# Patient Record
Sex: Female | Born: 1995 | Race: White | Hispanic: No | Marital: Single | Smoking: Never smoker
Health system: Southern US, Community
[De-identification: ages and names within clinical notes are randomized; demographics above are authoritative.]

## PROBLEM LIST (undated history)

## (undated) DIAGNOSIS — L940 Localized scleroderma [morphea]: Secondary | ICD-10-CM

## (undated) HISTORY — DX: Localized scleroderma (morphea): L94.0

---
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
## 2015-01-02 IMAGING — CR DG RIBS 2V*R*
2 series · 2 of 2 positions shown · non-contrast
Comparison: Chest x-ray same day.

CLINICAL DATA: Question focal lysis of right 11th rib

EXAM:
RIGHT RIBS - 2 VIEW

[rao (1 of 2)]
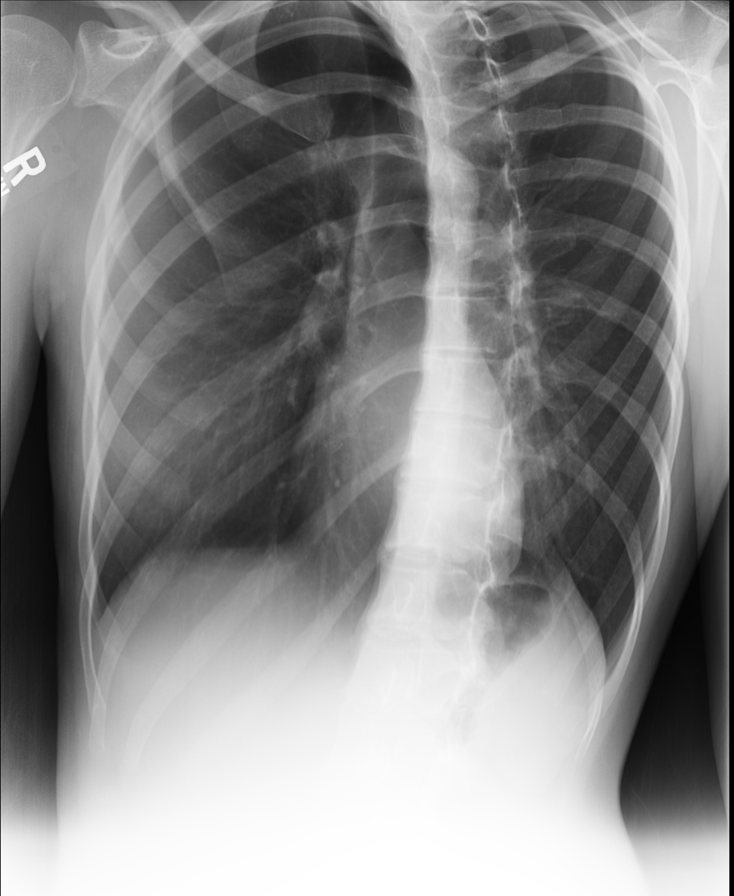

[rao (2 of 2)]
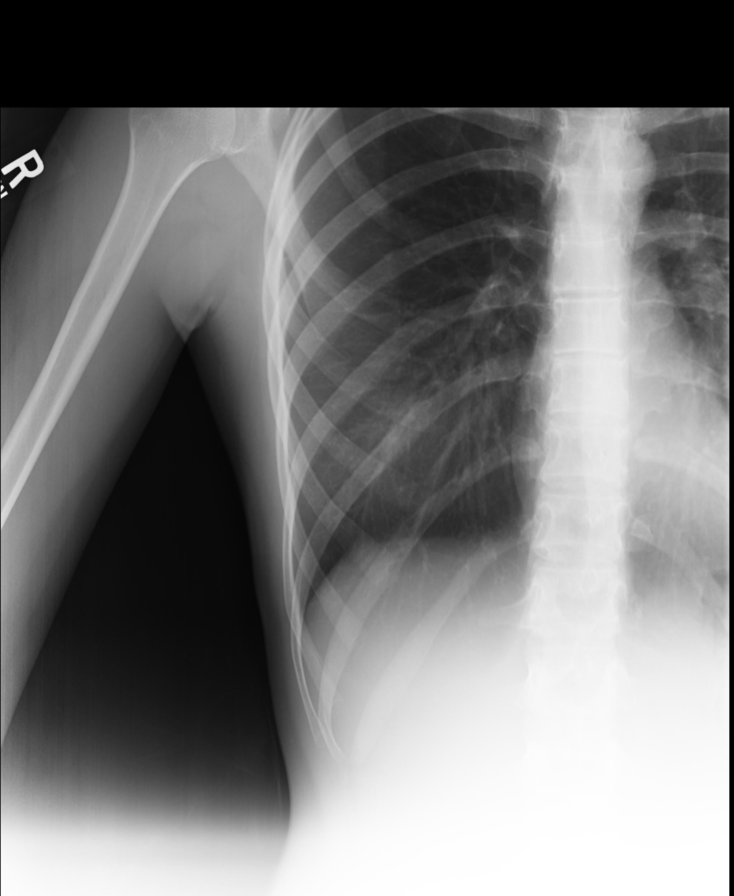

[2 of 2 positions shown; findings below may reference images not displayed]

FINDINGS: No fracture or other bone lesions are seen involving the ribs. No
diagnostic pneumothorax. Eleventh rib appears intact and appearance
on chest x-ray probable was artifactual.
IMPRESSION: Negative.

## 2015-10-04 ENCOUNTER — Ambulatory Visit (INDEPENDENT_AMBULATORY_CARE_PROVIDER_SITE_OTHER): Payer: BLUE CROSS/BLUE SHIELD | Admitting: Family Medicine

## 2015-10-04 VITALS — BP 102/64 | HR 85 | Temp 98.0°F | Resp 16 | Ht 66.0 in | Wt 106.0 lb

## 2015-10-04 DIAGNOSIS — Z131 Encounter for screening for diabetes mellitus: Secondary | ICD-10-CM

## 2015-10-04 DIAGNOSIS — Z1329 Encounter for screening for other suspected endocrine disorder: Secondary | ICD-10-CM

## 2015-10-04 DIAGNOSIS — L84 Corns and callosities: Secondary | ICD-10-CM

## 2015-10-04 DIAGNOSIS — Z0000 Encounter for general adult medical examination without abnormal findings: Secondary | ICD-10-CM | POA: Diagnosis not present

## 2015-10-04 DIAGNOSIS — Z114 Encounter for screening for human immunodeficiency virus [HIV]: Secondary | ICD-10-CM

## 2015-10-04 DIAGNOSIS — R002 Palpitations: Secondary | ICD-10-CM | POA: Diagnosis not present

## 2015-10-04 DIAGNOSIS — F411 Generalized anxiety disorder: Secondary | ICD-10-CM | POA: Diagnosis not present

## 2015-10-04 DIAGNOSIS — Z13220 Encounter for screening for lipoid disorders: Secondary | ICD-10-CM

## 2015-10-04 DIAGNOSIS — L219 Seborrheic dermatitis, unspecified: Secondary | ICD-10-CM

## 2015-10-04 DIAGNOSIS — R636 Underweight: Secondary | ICD-10-CM | POA: Diagnosis not present

## 2015-10-04 LAB — HEMOGLOBIN A1C
HEMOGLOBIN A1C: 5.3 % (ref <5.7)
MEAN PLASMA GLUCOSE: 105 mg/dL

## 2015-10-04 LAB — CBC WITH DIFFERENTIAL/PLATELET
BASOS ABS: 58 {cells}/uL (ref 0–200)
Basophils Relative: 1 %
Eosinophils Absolute: 116 cells/uL (ref 15–500)
Eosinophils Relative: 2 %
HEMATOCRIT: 42.3 % (ref 35.0–45.0)
HEMOGLOBIN: 14.2 g/dL (ref 11.7–15.5)
LYMPHS ABS: 1972 {cells}/uL (ref 850–3900)
Lymphocytes Relative: 34 %
MCH: 28.7 pg (ref 27.0–33.0)
MCHC: 33.6 g/dL (ref 32.0–36.0)
MCV: 85.6 fL (ref 80.0–100.0)
MPV: 9.8 fL (ref 7.5–12.5)
Monocytes Absolute: 290 cells/uL (ref 200–950)
Monocytes Relative: 5 %
NEUTROS ABS: 3364 {cells}/uL (ref 1500–7800)
NEUTROS PCT: 58 %
Platelets: 334 10*3/uL (ref 140–400)
RBC: 4.94 MIL/uL (ref 3.80–5.10)
RDW: 13.2 % (ref 11.0–15.0)
WBC: 5.8 10*3/uL (ref 3.8–10.8)

## 2015-10-04 LAB — LIPID PANEL
CHOL/HDL RATIO: 3 ratio (ref <=5.0)
CHOLESTEROL: 192 mg/dL — AB (ref 125–170)
HDL: 65 mg/dL (ref >=46)
LDL Cholesterol: 102 mg/dL (ref <110)
Triglycerides: 123 mg/dL (ref <150)
VLDL: 25 mg/dL (ref <30)

## 2015-10-04 LAB — COMPREHENSIVE METABOLIC PANEL
ALBUMIN: 4.7 g/dL (ref 3.6–5.1)
ALK PHOS: 64 U/L (ref 33–115)
ALT: 8 U/L (ref 6–29)
AST: 21 U/L (ref 10–30)
BILIRUBIN TOTAL: 0.3 mg/dL (ref 0.2–1.2)
BUN: 11 mg/dL (ref 7–25)
CALCIUM: 9.6 mg/dL (ref 8.6–10.2)
CO2: 22 mmol/L (ref 20–31)
CREATININE: 0.62 mg/dL (ref 0.50–1.10)
Chloride: 102 mmol/L (ref 98–110)
Glucose, Bld: 92 mg/dL (ref 65–99)
Potassium: 4 mmol/L (ref 3.5–5.3)
SODIUM: 137 mmol/L (ref 135–146)
TOTAL PROTEIN: 7.8 g/dL (ref 6.1–8.1)

## 2015-10-04 LAB — POCT URINALYSIS DIP (MANUAL ENTRY)
Bilirubin, UA: NEGATIVE
Blood, UA: NEGATIVE
Glucose, UA: NEGATIVE
Ketones, POC UA: NEGATIVE
Leukocytes, UA: NEGATIVE
Nitrite, UA: NEGATIVE
Protein Ur, POC: NEGATIVE
SPEC GRAV UA: 1.02
Urobilinogen, UA: 0.2
pH, UA: 5

## 2015-10-04 LAB — TSH: TSH: 1.6 m[IU]/L

## 2015-10-04 MED ORDER — KETOCONAZOLE 2 % EX SHAM: 1.0000 "application " | MEDICATED_SHAMPOO | CUTANEOUS | Status: DC

## 2015-10-04 NOTE — Patient Instructions (Addendum)
   IF you received an x-ray today, you will receive an invoice from [facility]. Please contact [facility] at [phone] with questions or concerns regarding your invoice.   IF you received labwork today, you will receive an invoice from [facility]. Please contact [facility] at [phone] with questions or concerns regarding your invoice.   Our billing staff will not be able to assist you with questions regarding bills from these companies.  You will be contacted with the lab results as soon as they are available. The fastest way to get your results is to activate your My Chart account. Instructions are located on the last page of this paperwork. If you have not heard from us regarding the results in 2 weeks, please contact this office.     Keeping You Healthy  Get These Tests 1. Blood Pressure- Have your blood pressure checked once a year by your health care provider.  Normal blood pressure is 120/80. 2. Weight- Have your body mass index (BMI) calculated to screen for obesity.  BMI is measure of body fat based on height and weight.  You can also calculate your own BMI at www.nhlbisupport.com/bmi/. 3. Cholesterol- Have your cholesterol checked every 5 years starting at age 20 then yearly starting at age 45. 4. Chlamydia, HIV, and other sexually transmitted diseases- Get screened every year until age 25, then within three months of each new sexual provider. 5. Pap Test - Every 1-5 years; discuss with your health care provider. 6. Mammogram- Every 1-2 years starting at age 40--50  Take these medicines  Calcium with Vitamin D-Your body needs 1200 mg of Calcium each day and 800-1000 IU of Vitamin D daily.  Your body can only absorb 500 mg of Calcium at a time so Calcium must be taken in 2 or 3 divided doses throughout the day.  Multivitamin with folic acid- Once daily if it is possible for you to become pregnant.  Get these  Immunizations  Gardasil-Series of three doses; prevents HPV related illness such as genital warts and cervical cancer.  Menactra-Single dose; prevents meningitis.  Tetanus shot- Every 10 years.  Flu shot-Every year.  Take these steps 1. Do not smoke-Your healthcare provider can help you quit.  For tips on how to quit go to www.smokefree.gov or call 1-800 QUITNOW. 2. Be physically active- Exercise 5 days a week for at least 30 minutes.  If you are not already physically active, start slow and gradually work up to 30 minutes of moderate physical activity.  Examples of moderate activity include walking briskly, dancing, swimming, bicycling, etc. 3. Breast Cancer- A self breast exam every month is important for early detection of breast cancer.  For more information and instruction on self breast exams, ask your healthcare provider or www.womenshealth.gov/faq/breast-self-exam.cfm. 4. Eat a healthy diet- Eat a variety of healthy foods such as fruits, vegetables, whole grains, low fat milk, low fat cheeses, yogurt, lean meats, poultry and fish, beans, nuts, tofu, etc.  For more information go to www. Thenutritionsource.org 5. Drink alcohol in moderation- Limit alcohol intake to one drink or less per day. Never drink and drive. 6. Depression- Your emotional health is as important as your physical health.  If you're feeling down or losing interest in things you normally enjoy please talk to your healthcare provider about being screened for depression. 7. Dental visit- Brush and floss your teeth twice daily; visit your dentist twice a year. 8. Eye doctor- Get an eye exam at least every   2 years. 9. Helmet use- Always wear a helmet when riding a bicycle, motorcycle, rollerblading or skateboarding. 10. Safe sex- If you may be exposed to sexually transmitted infections, use a condom. 11. Seat belts- Seat belts can save your live; always wear one. 12. Smoke/Carbon Monoxide detectors- These detectors need to  be installed on the appropriate level of your home. Replace batteries at least once a year. 13. Skin cancer- When out in the sun please cover up and use sunscreen 15 SPF or higher. 14. Violence- If anyone is threatening or hurting you, please tell your healthcare provider.       HPV Vaccine Gardasil (Human Papillomavirus): What You Need to Know 1. What is HPV? Genital human papillomavirus (HPV) is the most common sexually transmitted virus in the [place]. More than half of sexually active men and women are infected with HPV at some time in their lives. About 20 million Americans are currently infected, and about 6 million more get infected each year. HPV is usually spread through sexual contact. Most HPV infections don't cause any symptoms, and go away on their own. But HPV can cause cervical cancer in women. Cervical cancer is the 2nd leading cause of cancer deaths among women around the world. In the [place], about 12,000 women get cervical cancer every year and about 4,000 are expected to die from it. HPV is also associated with several less common cancers, such as vaginal and vulvar cancers in women, and anal and oropharyngeal (back of the throat, including base of tongue and tonsils) cancers in both men and women. HPV can also cause genital warts and warts in the throat. There is no cure for HPV infection, but some of the problems it causes can be treated. 2. HPV vaccine: Why get vaccinated? The HPV vaccine you are getting is one of two vaccines that can be given to prevent HPV. It may be given to both males and females.  This vaccine can prevent most cases of cervical cancer in females, if it is given before exposure to the virus. In addition, it can prevent vaginal and vulvar cancer in females, and genital warts and anal cancer in both males and females. Protection from HPV vaccine is expected to be long-lasting. But vaccination is not a substitute for cervical cancer  screening. Women should still get regular Pap tests. 3. Who should get this HPV vaccine and when? HPV vaccine is given as a 3-dose series  1st Dose: Now  2nd Dose: 1 to 2 months after Dose 1  3rd Dose: 6 months after Dose 1 Additional (booster) doses are not recommended. Routine vaccination 7. This HPV vaccine is recommended for girls and boys 33 or 20 years of age. It may be given starting at age 12. Why is HPV vaccine recommended at 47 or 20 years of age?  HPV infection is easily acquired, even with only one sex partner. That is why it is important to get HPV vaccine before any sexual contact takes place. Also, response to the vaccine is better at this age than at older ages. Catch-up vaccination This vaccine is recommended for the following people who have not completed the 3-dose series:  3. Females 13 through 21 years of age. 4. Males 13 through 20 years of age. This vaccine may be given to men 22 through 20 years of age who have not completed the 3-dose series. It is recommended for men through age 43 who have sex with men or whose immune system is  weakened because of HIV infection, other illness, or medications.  HPV vaccine may be given at the same time as other vaccines. 4. Some people should not get HPV vaccine or should wait. 5. Anyone who has ever had a life-threatening allergic reaction to any component of HPV vaccine, or to a previous dose of HPV vaccine, should not get the vaccine. Tell your doctor if the person getting vaccinated has any severe allergies, including an allergy to yeast. 6. HPV vaccine is not recommended for pregnant women. However, receiving HPV vaccine when pregnant is not a reason to consider terminating the pregnancy. Women who are breast feeding may get the vaccine. 7. People who are mildly ill when a dose of HPV is planned can still be vaccinated. People with a moderate or severe illness should wait until they are better. 5. What are the risks from this  vaccine? This HPV vaccine has been used in the [place] and around the world for about six years and has been very safe. However, any medicine could possibly cause a serious problem, such as a severe allergic reaction. The risk of any vaccine causing a serious injury, or death, is extremely small. Life-threatening allergic reactions from vaccines are very rare. If they do occur, it would be within a few minutes to a few hours after the vaccination. Several mild to moderate problems are known to occur with this HPV vaccine. These do not last long and go away on their own.  Reactions in the arm where the shot was given:  Pain (about 8 people in 10)  Redness or swelling (about 1 person in 4)  Fever:  Mild (100 F) (about 1 person in 10)  Moderate (102 F) (about 1 person in 76)  Other problems:  Headache (about 1 person in 3)  Fainting: Brief fainting spells and related symptoms (such as jerking movements) can happen after any medical procedure, including vaccination. Sitting or lying down for about 15 minutes after a vaccination can help prevent fainting and injuries caused by falls. Tell your doctor if the patient feels dizzy or light-headed, or has vision changes or ringing in the ears.  Like all vaccines, HPV vaccines will continue to be monitored for unusual or severe problems. 6. What if there is a serious reaction? What should I look for? 15. Look for anything that concerns you, such as signs of a severe allergic reaction, very high fever, or behavior changes. Signs of a severe allergic reaction can include hives, swelling of the face and throat, difficulty breathing, a fast heartbeat, dizziness, and weakness. These would start a few minutes to a few hours after the vaccination.  What should I do?  If you think it is a severe allergic reaction or other emergency that can't wait, call 9-1-1 or get the person to the nearest hospital. Otherwise, call your doctor.  Afterward, the  reaction should be reported to the Vaccine Adverse Event Reporting System (VAERS). Your doctor might file this report, or you can do it yourself through the VAERS web site at www.vaers.LAgents.no, or by calling 1-(810)865-8093. VAERS is only for reporting reactions. They do not give medical advice. 7. The National Vaccine Injury Compensation Program  The [employer] Vaccine Injury Compensation Program (VICP) is a federal program that was created to compensate people who may have been injured by certain vaccines.  Persons who believe they may have been injured by a vaccine can learn about the program and about filing a claim by calling 1-920-654-4895 or visiting the  VICP website at SpiritualWord.atwww.hrsa.gov/vaccinecompensation. 8. How can I learn more?  Ask your doctor.  Call your local or state health department.  Contact the Centers for Disease Control and Prevention (CDC):  Call (949)024-14951-435-767-7125 (1-800-CDC-INFO)  or  Visit CDC's website at PicCapture.uywww.cdc.gov/vaccines CDC Human Papillomavirus (HPV) Gardasil (Interim) 09/26/11   This information is not intended to replace advice given to you by your health care provider. Make sure you discuss any questions you have with your health care provider.   Document Released: 02/23/2006 Document Revised: 05/19/2014 Document Reviewed: 06/09/2013 Elsevier Interactive Patient Education [employer]2016 Elsevier Inc.

## 2015-10-04 NOTE — Progress Notes (Signed)
Subjective:    Patient ID: Jenny Wells, female    DOB: Mar 25, 1996, 20 y.o.   MRN: [id number]  10/04/2015  Annual Exam   HPI This 20 y.o. female presents for Complete Physical Examination.  Last physical:  09-09-2014 Pap smear: n/a; menses regular; bleeding 5 days; some cramping; sometimes  Heavy.   TDAP:  Last year at [facility]; 2016. Gardisil:  None; heard about it but not interested.  Influenza: none Eye exam:  +glasses; yearly. Dental exam:  Every six months;   Growth on toe:  R third toe; no pain.    Scalp: seborrhea dermatitis? Research?  Waxing build up and itchy.  Top of scalp.  Nizoral and dandruff shampoos with mild relief.  Mild improvement; stable and persistent.   B: oatmeal, water Snack: banana, water Lunch: chicken pannini, water; cucumber Snack: chips Supper: burger, cucumbers, water. Snack: none  Sweet tea.  Palpitations chronic; a few seconds; occurs each day.  S/p two EKGs in the past that are normal. Worried it is due to underlying anxiety; only suffers with health anxiety; not anxious about other things.   Trying to gain weight.  Started exercising yesterday to gain muscle mass.   Review of Systems  Constitutional: Negative for fever, chills, diaphoresis, activity change, appetite change, fatigue and unexpected weight change.  HENT: Negative for congestion, dental problem, drooling, ear discharge, ear pain, facial swelling, hearing loss, mouth sores, nosebleeds, postnasal drip, rhinorrhea, sinus pressure, sneezing, sore throat, tinnitus, trouble swallowing and voice change.   Eyes: Negative for photophobia, pain, discharge, redness, itching and visual disturbance.  Respiratory: Negative for apnea, cough, choking, chest tightness, shortness of breath, wheezing and stridor.   Cardiovascular: Positive for palpitations. Negative for chest pain and leg swelling.  Gastrointestinal: Negative for nausea, vomiting, abdominal pain, diarrhea, constipation, blood in  stool, abdominal distention, anal bleeding and rectal pain.  Endocrine: Negative for cold intolerance, heat intolerance, polydipsia, polyphagia and polyuria.  Genitourinary: Negative for dysuria, urgency, frequency, hematuria, flank pain, decreased urine volume, vaginal bleeding, vaginal discharge, enuresis, difficulty urinating, genital sores, vaginal pain, menstrual problem, pelvic pain and dyspareunia.  Musculoskeletal: Negative for myalgias, back pain, joint swelling, arthralgias, gait problem, neck pain and neck stiffness.  Skin: Positive for color change and rash. Negative for pallor and wound.  Allergic/Immunologic: Negative for environmental allergies, food allergies and immunocompromised state.  Neurological: Negative for dizziness, tremors, seizures, syncope, facial asymmetry, speech difficulty, weakness, light-headedness, numbness and headaches.  Hematological: Negative for adenopathy. Does not bruise/bleed easily.  Psychiatric/Behavioral: Negative for suicidal ideas, hallucinations, behavioral problems, confusion, sleep disturbance, self-injury, dysphoric mood, decreased concentration and agitation. The patient is nervous/anxious. The patient is not hyperactive.     History reviewed. No pertinent past medical history. History reviewed. No pertinent past surgical history. No Known Allergies Current Outpatient Prescriptions  Medication Sig Dispense Refill  . ketoconazole (NIZORAL) 2 % shampoo Apply 1 application topically 2 (two) times a week. 120 mL 11   No current facility-administered medications for this visit.   Social History   Social History  . Marital Status: Single    Spouse Name: N/A  . Number of Children: N/A  . Years of Education: N/A   Occupational History  . Not on file.   Social History Main Topics  . Smoking status: Never Smoker   . Smokeless tobacco: Never Used  . Alcohol Use: No  . Drug Use: No  . Sexual Activity: No   Other Topics Concern  . Not on  file  Social History Narrative   Marital status: single; not dating      Children: none      Lives: with parents, brother      Employment:  Consulting civil engineertudent      Education: [employer]; grades good; majoring in business; not sure of career plans.      Tobacco: none       Alcohol: none      Drugs: none      Seatbelt: 100%; no texting while driving      Sexual activity: none; males only.      Exercise: treadmill started yesterday.     Family History  Problem Relation Age of Onset  . Asthma Father        Objective:    BP 102/64 mmHg  Pulse 85  Temp(Src) 98 F (36.7 C)  Resp 16  Ht 5\' 6"  (1.676 m)  Wt 106 lb (48.081 kg)  BMI 17.12 kg/m2  SpO2 98%  LMP 09/15/2015 (Approximate) Physical Exam  Constitutional: She is oriented to person, place, and time. She appears well-developed and well-nourished. No distress.  HENT:  Head: Normocephalic and atraumatic.  Right Ear: External ear normal.  Left Ear: External ear normal.  Nose: Nose normal.  Mouth/Throat: Oropharynx is clear and moist.  Eyes: Conjunctivae and EOM are normal. Pupils are equal, round, and reactive to light.  Neck: Normal range of motion and full passive range of motion without pain. Neck supple. No JVD present. Carotid bruit is not present. No thyromegaly present.  Cardiovascular: Normal rate, regular rhythm and normal heart sounds.  Exam reveals no gallop and no friction rub.   No murmur heard. Pulmonary/Chest: Effort normal and breath sounds normal. She has no wheezes. She has no rales.  Abdominal: Soft. Bowel sounds are normal. She exhibits no distension and no mass. There is no tenderness. There is no rebound and no guarding.  Genitourinary:  Tanner IV.  Normal breast development  Musculoskeletal:       Right shoulder: Normal.       Left shoulder: Normal.       Cervical back: Normal.  Lymphadenopathy:    She has no cervical adenopathy.  Neurological: She is alert and oriented to person, place, and time. She  has normal reflexes. No cranial nerve deficit. She exhibits normal muscle tone. Coordination normal.  Skin: Skin is warm and dry. No rash noted. She is not diaphoretic. No erythema. No pallor.  R third toe with corn formation.  Scattered skin discoloration with thickening along back, torso.    Psychiatric: She has a normal mood and affect. Her behavior is normal. Judgment and thought content normal.  Nursing note and vitals reviewed.  Results for orders placed or performed in visit on 09/09/14  Comprehensive metabolic panel  Result Value Ref Range   Sodium 138 135 - 145 mEq/L   Potassium 4.0 3.5 - 5.3 mEq/L   Chloride 105 96 - 112 mEq/L   CO2 22 19 - 32 mEq/L   Glucose, Bld 92 70 - 99 mg/dL   BUN 11 6 - 23 mg/dL   Creat 1.610.59 0.960.50 - 0.451.10 mg/dL   Total Bilirubin 0.3 0.2 - 1.1 mg/dL   Alkaline Phosphatase 59 39 - 117 U/L   AST 21 0 - 37 U/L   ALT 13 0 - 35 U/L   Total Protein 7.4 6.0 - 8.3 g/dL   Albumin 4.5 3.5 - 5.2 g/dL   Calcium 9.3 8.4 - 40.910.5 mg/dL  CBC  Result  Value Ref Range   WBC 5.4 4.0 - 10.5 K/uL   RBC 4.62 3.87 - 5.11 MIL/uL   Hemoglobin 13.5 12.0 - 15.0 g/dL   HCT 16.1 09.6 - 04.5 %   MCV 85.7 78.0 - 100.0 fL   MCH 29.2 26.0 - 34.0 pg   MCHC 34.1 30.0 - 36.0 g/dL   RDW 40.9 81.1 - 91.4 %   Platelets 317 150 - 400 K/uL   MPV 9.4 8.6 - 12.4 fL  TSH  Result Value Ref Range   TSH 3.553 0.350 - 4.500 uIU/mL  Lipid panel  Result Value Ref Range   Cholesterol 168 0 - 200 mg/dL   Triglycerides 782 <956 mg/dL   HDL 47 36 - 76 mg/dL   Total CHOL/HDL Ratio 3.6 Ratio   VLDL 26 0 - 40 mg/dL   LDL Cholesterol 95 0 - 99 mg/dL       Assessment & Plan:   1. Routine physical examination   2. Screening for diabetes mellitus   3. Screening, lipid   4. Screening for thyroid disorder   5. Screening for HIV (human immunodeficiency virus)   6. Underweight   7. Corn of foot   8. Palpitations   9. Anxiety state   10. Seborrhea    -anticipatory guidance --- exercise,  weight gain; incorporate nuts, peanut butter, ice cream or whole milk into diet. -provided information on HPV series; also discussed at length during visit.  Pt to consider. -obtain screening labs per pt request. -normal menses; never sexually active. -corn on R foot asymptomatic; no intervention at this time. -suffering with waxy scalp yet benign exam; pt desires trial of Ketoconazole shampoo twice weekly; recommend dermatology evaluation if no improvement. -mild anxiety present --- advised regular exercise for anxiety management, 7-9 hours of sleep per night, avoidance of caffeine.  RTC if worsens. -mild palpitations chronic; s/p work up in the past; does not desire further work up at this time.   Orders Placed This Encounter  Procedures  . CBC with Differential/Platelet  . Comprehensive metabolic panel    Order Specific Question:  Has the patient fasted?    Answer:  Yes  . Hemoglobin A1c  . Lipid panel    Order Specific Question:  Has the patient fasted?    Answer:  Yes  . TSH  . HIV antibody  . Care order/instruction    AVS printed - let patient go!  Marland Kitchen POCT urinalysis dipstick   Meds ordered this encounter  Medications  . ketoconazole (NIZORAL) 2 % shampoo    Sig: Apply 1 application topically 2 (two) times a week.    Dispense:  120 mL    Refill:  11    No Follow-up on file.    Stavros Cail Paulita Fujita, M.D. [facility] [address] [place], [place]  [address] [phone] phone [phone] fax

## 2015-10-05 LAB — HIV ANTIBODY (ROUTINE TESTING W REFLEX): HIV 1&2 Ab, 4th Generation: NONREACTIVE

## 2015-10-17 ENCOUNTER — Ambulatory Visit (INDEPENDENT_AMBULATORY_CARE_PROVIDER_SITE_OTHER): Payer: BLUE CROSS/BLUE SHIELD | Admitting: Physician Assistant

## 2015-10-17 VITALS — BP 114/76 | HR 78 | Temp 97.9°F | Resp 16 | Ht 66.0 in | Wt 105.8 lb

## 2015-10-17 DIAGNOSIS — M25562 Pain in left knee: Secondary | ICD-10-CM | POA: Diagnosis not present

## 2015-10-17 NOTE — Patient Instructions (Signed)
     IF you received an x-ray today, you will receive an invoice from [facility]. Please contact [facility] at [phone] with questions or concerns regarding your invoice.   IF you received labwork today, you will receive an invoice from [facility]. Please contact [facility] at [phone] with questions or concerns regarding your invoice.   Our billing staff will not be able to assist you with questions regarding bills from these companies.  You will be contacted with the lab results as soon as they are available. The fastest way to get your results is to activate your My Chart account. Instructions are located on the last page of this paperwork. If you have not heard from us regarding the results in 2 weeks, please contact this office.      

## 2015-10-17 NOTE — Progress Notes (Signed)
   10/17/2015 2:52 PM   DOB: May 10, 1996 / MRN: [id number]  SUBJECTIVE:  Jenny Wells is a 20 y.o. female presenting for left anterior leg pain that is worse with plantar flexion.  State that she was walking on the treadmill bare footed roughly a week ago and the pain started after that. She is worried that it is a blood clot.  She denies leg swelling, does not take birth control, is a never smoke and has never had a blood clot.  Denies bony trauma.    She has No Known Allergies.   She  has no past medical history on file.    She  reports that she has never smoked. She has never used smokeless tobacco. She reports that she does not drink alcohol or use illicit drugs. She  reports that she does not engage in sexual activity. The patient  has no past surgical history on file.  Her family history includes Asthma in her father.  Review of Systems  Constitutional: Negative for fever and chills.  Respiratory: Negative for cough.   Cardiovascular: Negative for chest pain.  Gastrointestinal: Negative for nausea and vomiting.  Skin: Negative for rash.  Neurological: Negative for dizziness and headaches.    Problem list and medications reviewed and updated by myself where necessary, and exist elsewhere in the encounter.   OBJECTIVE:  BP 114/76 mmHg  Pulse 78  Temp(Src) 97.9 F (36.6 C) (Oral)  Resp 16  Ht 5\' 6"  (1.676 m)  Wt 105 lb 12.8 oz (47.991 kg)  BMI 17.08 kg/m2  SpO2 98%  LMP 09/15/2015 (Approximate)  Physical Exam  Constitutional: She is oriented to person, place, and time. She appears well-developed.  Eyes: EOM are normal. Pupils are equal, round, and reactive to light.  Cardiovascular: Normal rate and regular rhythm.   Pulmonary/Chest: Effort normal and breath sounds normal.  Abdominal: She exhibits no distension.  Musculoskeletal: Normal range of motion.       Legs: Neurological: She is alert and oriented to person, place, and time. No cranial nerve deficit.  Skin: Skin is  warm and dry. She is not diaphoretic.  Psychiatric: She has a normal mood and affect.  Vitals reviewed.   No results found for this or any previous visit (from the past 72 hour(s)).  No results found.  ASSESSMENT AND PLAN  Jenny Wells was seen today for leg pain.  Diagnoses and all orders for this visit:  Arthralgia of left lower leg: Most likely MSK pain from a originating from the peroneal group or the tibialis anterior.  She has no signs, symptoms, or history that would lend to the dx of a blood clot.  I offered to run a d-dimer to ease her mind however she then became concerned about the cost and deciding against this.     The patient was advised to call or return to clinic if she does not see an improvement in symptoms or to seek the care of the closest emergency department if she worsens with the above plan.   Deliah BostonMichael Clark, MHS, PA-C [facility] 10/17/2015 2:52 PM

## 2015-11-06 ENCOUNTER — Ambulatory Visit (INDEPENDENT_AMBULATORY_CARE_PROVIDER_SITE_OTHER): Payer: BLUE CROSS/BLUE SHIELD | Admitting: Family Medicine

## 2015-11-06 VITALS — BP 118/78 | HR 85 | Temp 97.8°F | Resp 18 | Ht 66.0 in | Wt 106.0 lb

## 2015-11-06 DIAGNOSIS — M79662 Pain in left lower leg: Secondary | ICD-10-CM

## 2015-11-06 DIAGNOSIS — S8011XA Contusion of right lower leg, initial encounter: Secondary | ICD-10-CM | POA: Diagnosis not present

## 2015-11-06 DIAGNOSIS — F418 Other specified anxiety disorders: Secondary | ICD-10-CM

## 2015-11-06 LAB — POCT CBC
Granulocyte percent: 63.7 %G (ref 37–80)
HCT, POC: 39.9 % (ref 37.7–47.9)
Hemoglobin: 14.2 g/dL (ref 12.2–16.2)
LYMPH, POC: 2.1 (ref 0.6–3.4)
MCH, POC: 29.8 pg (ref 27–31.2)
MCHC: 35.6 g/dL — AB (ref 31.8–35.4)
MCV: 83.5 fL (ref 80–97)
MID (CBC): 0.4 (ref 0–0.9)
MPV: 7.3 fL (ref 0–99.8)
PLATELET COUNT, POC: 307 10*3/uL (ref 142–424)
POC Granulocyte: 4.3 (ref 2–6.9)
POC LYMPH %: 30.7 % (ref 10–50)
POC MID %: 5.6 %M (ref 0–12)
RBC: 4.78 M/uL (ref 4.04–5.48)
RDW, POC: 12.8 %
WBC: 6.7 10*3/uL (ref 4.6–10.2)

## 2015-11-06 LAB — D-DIMER, QUANTITATIVE (NOT AT ARMC)

## 2015-11-06 NOTE — Progress Notes (Addendum)
Subjective:  By signing my name below, I, Jenny Wells, attest that this documentation has been prepared under the direction and in the presence of Merri Ray, MD. Electronically Signed: Moises Wells, [place]. 11/06/2015 , 5:26 PM .  Patient was seen in Room 5 .   Patient ID: Jenny Wells, female    DOB: May 02, 1996, 20 y.o.   MRN: [id number] Chief Complaint  Patient presents with  . Leg Pain    Left leg pain   HPI Jenny Wells is a 20 y.o. female Here for left leg pain. She was seen 3 weeks ago for left anterior lower leg pain. She noted after walking on a treadmill bare-footed 1 week prior. No apparent risk factors for DVT and d-dimer was declined. She is more concerned after Googling about her symptoms.   Patient states the pain is still present and so she wants to have a d-dimer done. She notes the pain has radiated to the posterior of her left lower leg. She informs the pain happens multiple times a day at random times. She was given ace wrap during previous visit without relief. She's also tried elevating her leg without relief. She denies pain with walking. She denies change in pain since previous visit. She denies the pain waking her up at night. She denies taking OTC medications. She denies recent exercising. She denies family history for Wells clots or DVT's. She denies problems with her periods.   She also mentions bruising over her right lower leg. She notes also having a small bruise over her left leg but this has resolved.   She also notes having anxiety and causing her to have more shortness of breath recently. She believes her anxiety has worsened due to this left leg pain. She has anxiety during school but didn't think it was severe enough for treatment.   She attends [employer] for business. She usually drives to class.  She's brought in by her mother today.   There are no active problems to display for this patient.  No past medical history on file. No past surgical  history on file. No Known Allergies Prior to Admission medications   Not on File   Social History   Social History  . Marital Status: Single    Spouse Name: N/A  . Number of Children: N/A  . Years of Education: N/A   Occupational History  . Not on file.   Social History Main Topics  . Smoking status: Never Smoker   . Smokeless tobacco: Never Used  . Alcohol Use: No  . Drug Use: No  . Sexual Activity: No   Other Topics Concern  . Not on file   Social History Narrative   Marital status: single; not dating      Children: none      Lives: with parents, brother      Employment:  Ship broker      Education: [employer] sophomore; grades good; majoring in business; not sure of career plans.      Tobacco: none       Alcohol: none      Drugs: none      Seatbelt: 100%; no texting while driving      Sexual activity: none; males only.      Exercise: treadmill started yesterday.     Review of Systems  Constitutional: Negative for fever, chills and fatigue.  Cardiovascular: Negative for leg swelling.  Gastrointestinal: Negative for nausea, vomiting and diarrhea.  Genitourinary: Negative for menstrual problem.  Musculoskeletal:  Positive for myalgias. Negative for joint swelling, arthralgias and gait problem.  Skin: Negative for wound.  Psychiatric/Behavioral: The patient is nervous/anxious.        Objective:   Physical Exam  Constitutional: She is oriented to person, place, and time. She appears well-developed and well-nourished. No distress.  HENT:  Head: Normocephalic and atraumatic.  Eyes: EOM are normal. Pupils are equal, round, and reactive to light.  Neck: Neck supple.  Cardiovascular: Normal rate.   Pulmonary/Chest: Effort normal. No respiratory distress.  Musculoskeletal: Normal range of motion.  Calf is non tender, negative homans, pain-free ROM of ankle including with resisted dorsal flexion and plantar flexion Calf diameter 15cm below patellar, left leg: 30cm Calf  diameter 15cm below patellar, right leg: 30cm  Neurological: She is alert and oriented to person, place, and time.  Skin: Skin is warm and dry.  She has 2 very small healing ecchymotic areas over right lower leg Left lower leg with healing ecchymotic area over lower medial mid tibia area, otherwise skin intact; NVI distally, cap refill <1 second  Psychiatric: She has a normal mood and affect. Her behavior is normal.  Nursing note and vitals reviewed.   Filed Vitals:   11/06/15 1623  BP: 118/78  Pulse: 85  Temp: 97.8 F (36.6 C)  TempSrc: Oral  Resp: 18  Height: '5\' 6"'  (1.676 m)  Weight: 106 lb (48.081 kg)  SpO2: 99%   Results for orders placed or performed in visit on 11/06/15  POCT CBC  Result Value Ref Range   WBC 6.7 4.6 - 10.2 K/uL   Lymph, poc 2.1 0.6 - 3.4   POC LYMPH PERCENT 30.7 10 - 50 %L   MID (cbc) 0.4 0 - 0.9   POC MID % 5.6 0 - 12 %M   POC Granulocyte 4.3 2 - 6.9   Granulocyte percent 63.7 37 - 80 %G   RBC 4.78 4.04 - 5.48 M/uL   Hemoglobin 14.2 12.2 - 16.2 g/dL   HCT, POC 39.9 37.7 - 47.9 %   MCV 83.5 80 - 97 fL   MCH, POC 29.8 27 - 31.2 pg   MCHC 35.6 (A) 31.8 - 35.4 g/dL   RDW, POC 12.8 %   Platelet Count, POC 307 142 - 424 K/uL   MPV 7.3 0 - 99.8 fL       Assessment & Plan:    Jenny Wells is a 20 y.o. female Calf pain, left - Plan: D-dimer, quantitative (not at [facility])  Superficial bruising of lower leg, right, initial encounter - Plan: POCT CBC  Situational anxiety  No known DVT risk factors, but with persistent pain - DDimer obtained. Cbc reassuring. Suspected tendinopathy or myalgia of tib anterior.  Symptomatic care discussed and slow progression of exercise as tolerated. rtc precautions discussed.   Suspect component of anxiety as well. Stress mgt techniques discussed and h/o given with counselor names/numbers. rtc precautions if persistent.   Meds ordered this encounter  Medications  . ketoconazole (NIZORAL) 2 % shampoo    Sig: APPLY 1  APPLICATION TOPICALLY 2 (TWO) TIMES A WEEK.    Refill:  11   Patient Instructions       IF you received an x-ray today, you will receive an invoice from [facility]. Please contact [facility] at [phone] with questions or concerns regarding your invoice.   IF you received labwork today, you will receive an invoice from [employer]. Please contact [facility] at [phone] with questions or concerns  regarding your invoice.   Our billing staff will not be able to assist you with questions regarding bills from these companies.  You will be contacted with the lab results as soon as they are available. The fastest way to get your results is to activate your My Chart account. Instructions are located on the last page of this paperwork. If you have not heard from [place] regarding the results in 2 weeks, please contact this office.     I am reassured by your exam today, but will check other Wells tests as discussed. You likely had a strain or inflammation of one of the muscles in front of your lower leg. You can try Aleve over-the-counter once or twice per day, range of motion and stretches and return to some form of exercise/activity slowly. Start with 20-30 minutes of walking or swimming, and workup from there.  If you are having persistent anxiety, I do recommend discussing this with a counselor either at school, or other numbers below. Also provided information on stress and stress management below. Follow-up with [place] to discuss this further if you feel it is not improving, but exercise can also help with stress and anxiety symptoms.  Vivia Budge: [phone] Arvil Chaco: [phone]   Stress and Stress Management Stress is a normal reaction to life events. It is what you feel when life demands more than you are used to or more than you can handle. Some stress can be useful. For example, the stress reaction can help you catch the last bus of the day,  study for a test, or meet a deadline at work. But stress that occurs too often or for too long can cause problems. It can affect your emotional health and interfere with relationships and normal daily activities. Too much stress can weaken your immune system and increase your risk for physical illness. If you already have a medical problem, stress can make it worse. CAUSES  All sorts of life events may cause stress. An event that causes stress for one person may not be stressful for another person. Major life events commonly cause stress. These may be positive or negative. Examples include losing your job, moving into a new home, getting married, having a baby, or losing a loved one. Less obvious life events may also cause stress, especially if they occur day after day or in combination. Examples include working long hours, driving in traffic, caring for children, being in debt, or being in a difficult relationship. SIGNS AND SYMPTOMS Stress may cause emotional symptoms including, the following:  Anxiety. This is feeling worried, afraid, on edge, overwhelmed, or out of control.  Anger. This is feeling irritated or impatient.  Depression. This is feeling sad, down, helpless, or guilty.  Difficulty focusing, remembering, or making decisions. Stress may cause physical symptoms, including the following:   Aches and pains. These may affect your head, neck, back, stomach, or other areas of your body.  Tight muscles or clenched jaw.  Low energy or trouble sleeping. Stress may cause unhealthy behaviors, including the following:   Eating to feel better (overeating) or skipping meals.  Sleeping too little, too much, or both.  Working too much or putting off tasks (procrastination).  Smoking, drinking alcohol, or using drugs to feel better. DIAGNOSIS  Stress is diagnosed through an assessment by your health care provider. Your health care provider will ask questions about your symptoms and any  stressful life events.Your health care provider will also ask about your medical history and  may order Wells tests or other tests. Certain medical conditions and medicine can cause physical symptoms similar to stress. Mental illness can cause emotional symptoms and unhealthy behaviors similar to stress. Your health care provider may refer you to a mental health professional for further evaluation.  TREATMENT  Stress management is the recommended treatment for stress.The goals of stress management are reducing stressful life events and coping with stress in healthy ways.  Techniques for reducing stressful life events include the following:  Stress identification. Self-monitor for stress and identify what causes stress for you. These skills may help you to avoid some stressful events.  Time management. Set your priorities, keep a calendar of events, and learn to say "no." These tools can help you avoid making too many commitments. Techniques for coping with stress include the following:  Rethinking the problem. Try to think realistically about stressful events rather than ignoring them or overreacting. Try to find the positives in a stressful situation rather than focusing on the negatives.  Exercise. Physical exercise can release both physical and emotional tension. The key is to find a form of exercise you enjoy and do it regularly.  Relaxation techniques. These relax the body and mind. Examples include yoga, meditation, tai chi, biofeedback, deep breathing, progressive muscle relaxation, listening to music, being out in nature, journaling, and other hobbies. Again, the key is to find one or more that you enjoy and can do regularly.  Healthy lifestyle. Eat a balanced diet, get plenty of sleep, and do not smoke. Avoid using alcohol or drugs to relax.  Strong support network. Spend time with family, friends, or other people you enjoy being around.Express your feelings and talk things over with  someone you trust. Counseling or talktherapy with a mental health professional may be helpful if you are having difficulty managing stress on your own. Medicine is typically not recommended for the treatment of stress.Talk to your health care provider if you think you need medicine for symptoms of stress. HOME CARE INSTRUCTIONS  Keep all follow-up visits as directed by your health care provider.  Take all medicines as directed by your health care provider. SEEK MEDICAL CARE IF:  Your symptoms get worse or you start having new symptoms.  You feel overwhelmed by your problems and can no longer manage them on your own. SEEK IMMEDIATE MEDICAL CARE IF:  You feel like hurting yourself or someone else.   This information is not intended to replace advice given to you by your health care provider. Make sure you discuss any questions you have with your health care provider.   Document Released: 10/22/2000 Document Revised: 05/19/2014 Document Reviewed: 12/21/2012 Elsevier Interactive Patient Education [employer].     I personally performed the services described in this documentation, which was scribed in my presence. The recorded information has been reviewed and considered, and addended by me as needed.   Signed,   Merri Ray, MD [facility].  11/06/2015 5:51 PM

## 2015-11-06 NOTE — Patient Instructions (Addendum)
IF you received an x-ray today, you will receive an invoice from [facility]. Please contact [facility] at [phone] with questions or concerns regarding your invoice.   IF you received labwork today, you will receive an invoice from [employer]. Please contact [facility] at [phone] with questions or concerns regarding your invoice.   Our billing staff will not be able to assist you with questions regarding bills from these companies.  You will be contacted with the lab results as soon as they are available. The fastest way to get your results is to activate your My Chart account. Instructions are located on the last page of this paperwork. If you have not heard from [place] regarding the results in 2 weeks, please contact this office.     I am reassured by your exam today, but will check other blood tests as discussed. You likely had a strain or inflammation of one of the muscles in front of your lower leg. You can try Aleve over-the-counter once or twice per day, range of motion and stretches and return to some form of exercise/activity slowly. Start with 20-30 minutes of walking or swimming, and workup from there.  If you are having persistent anxiety, I do recommend discussing this with a counselor either at school, or other numbers below. Also provided information on stress and stress management below. Follow-up with [place] to discuss this further if you feel it is not improving, but exercise can also help with stress and anxiety symptoms.  Vivia Budge: [phone] Arvil Chaco: [phone]   Stress and Stress Management Stress is a normal reaction to life events. It is what you feel when life demands more than you are used to or more than you can handle. Some stress can be useful. For example, the stress reaction can help you catch the last bus of the day, study for a test, or meet a deadline at work. But stress that occurs too often or for too  long can cause problems. It can affect your emotional health and interfere with relationships and normal daily activities. Too much stress can weaken your immune system and increase your risk for physical illness. If you already have a medical problem, stress can make it worse. CAUSES  All sorts of life events may cause stress. An event that causes stress for one person may not be stressful for another person. Major life events commonly cause stress. These may be positive or negative. Examples include losing your job, moving into a new home, getting married, having a baby, or losing a loved one. Less obvious life events may also cause stress, especially if they occur day after day or in combination. Examples include working long hours, driving in traffic, caring for children, being in debt, or being in a difficult relationship. SIGNS AND SYMPTOMS Stress may cause emotional symptoms including, the following:  Anxiety. This is feeling worried, afraid, on edge, overwhelmed, or out of control.  Anger. This is feeling irritated or impatient.  Depression. This is feeling sad, down, helpless, or guilty.  Difficulty focusing, remembering, or making decisions. Stress may cause physical symptoms, including the following:   Aches and pains. These may affect your head, neck, back, stomach, or other areas of your body.  Tight muscles or clenched jaw.  Low energy or trouble sleeping. Stress may cause unhealthy behaviors, including the following:   Eating to feel better (overeating) or skipping meals.  Sleeping too little, too much, or both.  Working too  much or putting off tasks (procrastination).  Smoking, drinking alcohol, or using drugs to feel better. DIAGNOSIS  Stress is diagnosed through an assessment by your health care provider. Your health care provider will ask questions about your symptoms and any stressful life events.Your health care provider will also ask about your medical history  and may order blood tests or other tests. Certain medical conditions and medicine can cause physical symptoms similar to stress. Mental illness can cause emotional symptoms and unhealthy behaviors similar to stress. Your health care provider may refer you to a mental health professional for further evaluation.  TREATMENT  Stress management is the recommended treatment for stress.The goals of stress management are reducing stressful life events and coping with stress in healthy ways.  Techniques for reducing stressful life events include the following:  Stress identification. Self-monitor for stress and identify what causes stress for you. These skills may help you to avoid some stressful events.  Time management. Set your priorities, keep a calendar of events, and learn to say "no." These tools can help you avoid making too many commitments. Techniques for coping with stress include the following:  Rethinking the problem. Try to think realistically about stressful events rather than ignoring them or overreacting. Try to find the positives in a stressful situation rather than focusing on the negatives.  Exercise. Physical exercise can release both physical and emotional tension. The key is to find a form of exercise you enjoy and do it regularly.  Relaxation techniques. These relax the body and mind. Examples include yoga, meditation, tai chi, biofeedback, deep breathing, progressive muscle relaxation, listening to music, being out in nature, journaling, and other hobbies. Again, the key is to find one or more that you enjoy and can do regularly.  Healthy lifestyle. Eat a balanced diet, get plenty of sleep, and do not smoke. Avoid using alcohol or drugs to relax.  Strong support network. Spend time with family, friends, or other people you enjoy being around.Express your feelings and talk things over with someone you trust. Counseling or talktherapy with a mental health professional may be  helpful if you are having difficulty managing stress on your own. Medicine is typically not recommended for the treatment of stress.Talk to your health care provider if you think you need medicine for symptoms of stress. HOME CARE INSTRUCTIONS  Keep all follow-up visits as directed by your health care provider.  Take all medicines as directed by your health care provider. SEEK MEDICAL CARE IF:  Your symptoms get worse or you start having new symptoms.  You feel overwhelmed by your problems and can no longer manage them on your own. SEEK IMMEDIATE MEDICAL CARE IF:  You feel like hurting yourself or someone else.   This information is not intended to replace advice given to you by your health care provider. Make sure you discuss any questions you have with your health care provider.   Document Released: 10/22/2000 Document Revised: 05/19/2014 Document Reviewed: 12/21/2012 Elsevier Interactive Patient Education [employer].

## 2017-09-04 ENCOUNTER — Telehealth: Payer: Self-pay | Admitting: Physician Assistant

## 2017-09-04 NOTE — Telephone Encounter (Signed)
Called pt to originally tell them that their CPE MAY be to close together (within the 1 year time frame) - realized that it was in 2017. Apologized for the mistake and told them we would see them at their appt on 09/12/17.

## 2017-09-12 ENCOUNTER — Other Ambulatory Visit: Payer: Self-pay

## 2017-09-12 ENCOUNTER — Ambulatory Visit (INDEPENDENT_AMBULATORY_CARE_PROVIDER_SITE_OTHER): Payer: PRIVATE HEALTH INSURANCE | Admitting: Physician Assistant

## 2017-09-12 ENCOUNTER — Encounter: Payer: Self-pay | Admitting: Physician Assistant

## 2017-09-12 VITALS — BP 110/72 | HR 89 | Temp 98.9°F | Resp 16 | Ht 65.0 in | Wt 114.2 lb

## 2017-09-12 DIAGNOSIS — Z124 Encounter for screening for malignant neoplasm of cervix: Secondary | ICD-10-CM | POA: Diagnosis not present

## 2017-09-12 DIAGNOSIS — Z13220 Encounter for screening for lipoid disorders: Secondary | ICD-10-CM | POA: Diagnosis not present

## 2017-09-12 DIAGNOSIS — Z1329 Encounter for screening for other suspected endocrine disorder: Secondary | ICD-10-CM

## 2017-09-12 DIAGNOSIS — N898 Other specified noninflammatory disorders of vagina: Secondary | ICD-10-CM | POA: Diagnosis not present

## 2017-09-12 DIAGNOSIS — Z130 Encounter for screening for diseases of the blood and blood-forming organs and certain disorders involving the immune mechanism: Secondary | ICD-10-CM | POA: Diagnosis not present

## 2017-09-12 DIAGNOSIS — Z13228 Encounter for screening for other metabolic disorders: Secondary | ICD-10-CM

## 2017-09-12 DIAGNOSIS — Z0000 Encounter for general adult medical examination without abnormal findings: Secondary | ICD-10-CM | POA: Diagnosis not present

## 2017-09-12 DIAGNOSIS — Z1389 Encounter for screening for other disorder: Secondary | ICD-10-CM

## 2017-09-12 LAB — POCT WET + KOH PREP
Trich by wet prep: ABSENT
Yeast by KOH: ABSENT
Yeast by wet prep: ABSENT

## 2017-09-12 LAB — POCT URINALYSIS DIP (MANUAL ENTRY)
BILIRUBIN UA: NEGATIVE
BILIRUBIN UA: NEGATIVE mg/dL
Blood, UA: NEGATIVE
Glucose, UA: NEGATIVE mg/dL
LEUKOCYTES UA: NEGATIVE
Nitrite, UA: NEGATIVE
PH UA: 6 (ref 5.0–8.0)
Protein Ur, POC: NEGATIVE mg/dL
Urobilinogen, UA: 0.2 E.U./dL

## 2017-09-12 NOTE — Progress Notes (Signed)
Jenny Wells  MRN: [id number] DOB: 1995-09-15  Subjective:  Pt is a 22 y.o. female who presents for annual physical exam. Pt is fasting today.   Diet: Oatmeal for breakfast, chicken/vegetable for lunch, similar to lunch. Eating more vegetables now. Eats good amount of fruit. Not eating out much. Drinks mostly water. Gets a good amount of dairy per week. Does not take multivitamin.  Exercise: No structured exercise.  Sleep: 6 hours a night.   BM: One per day.  Menstrual cycles: Regular, occur monthly, lasts 5-6 days. Denies dysmenorrhea and menorrhagia. LMP 08/19/17.  Pt has never been sexually active.  Last dental exam: Recently, within the past 3 months. Brushes twice daily. Flosses daily. Last vision exam: Recently, within past 6 months. Wears Rx eyeglasses/contacts. Last pap smear: Never  Vaccinations      Tetanus: 2015      HPV: Has not started series, declines today     There are no active problems to display for this patient.   Current Outpatient Medications on File Prior to Visit  Medication Sig Dispense Refill  . ketoconazole (NIZORAL) 2 % shampoo APPLY 1 APPLICATION TOPICALLY 2 (TWO) TIMES A WEEK.  11   No current facility-administered medications on file prior to visit.     No Known Allergies  Social History   Socioeconomic History  . Marital status: Single    Spouse name: Not on file  . Number of children: 0  . Years of education: Not on file  . Highest education level: Not on file  Occupational History  . Occupation: Ship broker  Social Needs  . Financial resource strain: Not hard at all  . Food insecurity:    Worry: Never true    Inability: Never true  . Transportation needs:    Medical: No    Non-medical: No  Tobacco Use  . Smoking status: Never Smoker  . Smokeless tobacco: Never Used  Substance and Sexual Activity  . Alcohol use: No  . Drug use: No  . Sexual activity: Never    Birth control/protection: None  Lifestyle  . Physical activity:     Days per week: 0 days    Minutes per session: Not on file  . Stress: To some extent  Relationships  . Social connections:    Talks on phone: Not on file    Gets together: Not on file    Attends religious service: Not on file    Active member of club or organization: Not on file    Attends meetings of clubs or organizations: Not on file    Relationship status: Not on file  Other Topics Concern  . Not on file  Social History Narrative   Pt was born in [place]. Has lived in [place] since age 4.        Marital status: single; not dating      Children: none      Lives: with parents, brother      Employment:  Ship broker      Education: Museum/gallery exhibitions officer; grades good; majoring in business; not sure about career      Tobacco: none       Alcohol: none      Drugs: none      Seatbelt: 100%; no texting while driving      Sexual activity: none; males only.      Exercise: no structured exercise     No past surgical history on file.  Family History  Problem Relation Age of Onset  .  Hypothyroidism Mother   . Asthma Father     Review of Systems  Constitutional: Negative for activity change, appetite change, chills, diaphoresis, fatigue, fever and unexpected weight change.  HENT: Negative for congestion, dental problem, drooling, ear discharge, ear pain, facial swelling, hearing loss, mouth sores, nosebleeds, postnasal drip, rhinorrhea, sinus pressure, sinus pain, sneezing, sore throat, tinnitus, trouble swallowing and voice change.   Eyes: Negative for photophobia, pain, discharge, redness, itching and visual disturbance.  Respiratory: Negative for apnea, cough, choking, chest tightness, shortness of breath, wheezing and stridor.   Cardiovascular: Negative for chest pain, palpitations and leg swelling.  Gastrointestinal: Negative for abdominal distention, abdominal pain, anal bleeding, blood in stool, constipation, diarrhea, nausea, rectal pain and vomiting.  Endocrine: Negative for cold  intolerance, heat intolerance, polydipsia, polyphagia and polyuria.  Genitourinary: Negative for decreased urine volume, difficulty urinating, dyspareunia, dysuria, enuresis, flank pain, frequency, genital sores, hematuria, menstrual problem, pelvic pain, urgency, vaginal bleeding, vaginal discharge and vaginal pain.       Positive for intermittent vaginal itching over past month. Has tried monostat and diflucan with no full relief.  Musculoskeletal: Negative for arthralgias, back pain, gait problem, joint swelling, myalgias, neck pain and neck stiffness.  Skin: Negative for color change, pallor, rash and wound.  Allergic/Immunologic: Negative for environmental allergies, food allergies and immunocompromised state.  Neurological: Negative for dizziness, tremors, seizures, syncope, facial asymmetry, speech difficulty, weakness, light-headedness, numbness and headaches.  Hematological: Negative for adenopathy. Does not bruise/bleed easily.  Psychiatric/Behavioral: Negative for agitation, behavioral problems, confusion, decreased concentration, dysphoric mood, hallucinations, self-injury, sleep disturbance and suicidal ideas. The patient is not nervous/anxious and is not hyperactive.     Objective:  BP 110/72 (BP Location: Left Arm, Patient Position: Sitting, Cuff Size: Normal)   Pulse 89   Temp 98.9 F (37.2 C) (Oral)   Resp 16   Ht _0  (1.651 m)   Wt 114 lb 3.2 oz (51.8 kg)   LMP 08/19/2017   SpO2 100%   BMI 19.00 kg/m   Physical Exam  Constitutional: She is oriented to person, place, and time.  HENT:  Head: Normocephalic and atraumatic.  Right Ear: Hearing, tympanic membrane, external ear and ear canal normal.  Left Ear: Hearing, tympanic membrane, external ear and ear canal normal.  Nose: Nose normal.  Mouth/Throat: Uvula is midline, oropharynx is clear and moist and mucous membranes are normal. No oropharyngeal exudate.  Eyes: Pupils are equal, round, and reactive to light.  Conjunctivae, EOM and lids are normal. No scleral icterus.  Neck: Trachea normal and normal range of motion. No thyroid mass and no thyromegaly present.  Cardiovascular: Normal rate, regular rhythm, normal heart sounds and intact distal pulses.  Pulmonary/Chest: Effort normal and breath sounds normal.  Abdominal: Soft. Normal appearance and bowel sounds are normal. There is no tenderness.  Genitourinary: Uterus normal. Uterus is not enlarged, not fixed and not tender. Cervix exhibits no motion tenderness. Right adnexum displays no mass, no tenderness and no fullness. Left adnexum displays no mass, no tenderness and no fullness. Vaginal discharge (mild amount of white discharge noted) found.  Genitourinary Comments: Chaperone present for GU exam.  Lymphadenopathy:       Head (right side): No tonsillar, no preauricular, no posterior auricular and no occipital adenopathy present.       Head (left side): No tonsillar, no preauricular, no posterior auricular and no occipital adenopathy present.    She has no cervical adenopathy.       Right: No supraclavicular adenopathy  present.       Left: No supraclavicular adenopathy present.  Neurological: She is alert and oriented to person, place, and time. She has normal strength and normal reflexes.  Skin: Skin is warm and dry.  Scattered skin discoloration noted.      Visual Acuity Screening   Right eye Left eye Both eyes  Without correction:     With correction: _0   Results for orders placed or performed in visit on 09/12/17 (from the past 24 hour(s))  POCT urinalysis dipstick     Status: Abnormal   Collection Time: 09/12/17  9:21 AM  Result Value Ref Range   Color, UA yellow yellow   Clarity, UA clear clear   Glucose, UA negative negative mg/dL   Bilirubin, UA negative negative   Ketones, POC UA negative negative mg/dL   Spec Grav, UA <=1.005 (A) 1.010 - 1.025   Blood, UA negative negative   pH, UA 6.0 5.0 - 8.0   Protein Ur,  POC negative negative mg/dL   Urobilinogen, UA 0.2 0.2 or 1.0 E.U./dL   Nitrite, UA Negative Negative   Leukocytes, UA Negative Negative  POCT Wet + KOH Prep     Status: Abnormal   Collection Time: 09/12/17 10:05 AM  Result Value Ref Range   Yeast by KOH Absent Absent   Yeast by wet prep Absent Absent   WBC by wet prep Few Few   Clue Cells Wet Prep HPF POC None None   Trich by wet prep Absent Absent   Bacteria Wet Prep HPF POC Few Few   Epithelial Cells By [employer] Pref ([facility]) Moderate (A) None, Few, Too numerous to count   RBC,UR,HPF,POC None None RBC/hpf    Assessment and Plan :  Discussed healthy lifestyle, diet, exercise, preventative care, vaccinations, and addressed patient's concerns. Plan for follow up in one year. Otherwise, plan for specific conditions below.  1. Annual physical exam Await lab results.  2. Screening, anemia, deficiency, iron - CBC with Differential/Platelet  3. Screening for metabolic disorder - [id number]  4. Screening for thyroid disorder - TSH  5. Screening for cervical cancer - Pap IG, CT/NG w/ reflex HPV when ASC-U  6. Screening for hematuria or proteinuria - POCT urinalysis dipstick  7. Screening cholesterol level - Lipid panel  8. Vaginal itching - POCT Wet + KOH Prep  Tenna Delaine PA-C  [facility] 09/12/2017 8:58 AM

## 2017-09-12 NOTE — Patient Instructions (Addendum)
We should have your labs back within one week. Thank you for letting me participate in your health and well being.  Health Maintenance, Female Adopting a healthy lifestyle and getting preventive care can go a long way to promote health and wellness. Talk with your health care provider about what schedule of regular examinations is right for you. This is a good chance for you to check in with your provider about disease prevention and staying healthy. In between checkups, there are plenty of things you can do on your own. Experts have done a lot of research about which lifestyle changes and preventive measures are most likely to keep you healthy. Ask your health care provider for more information. Weight and diet Eat a healthy diet  Be sure to include plenty of vegetables, fruits, low-fat dairy products, and lean protein.  Do not eat a lot of foods high in solid fats, added sugars, or salt.  Get regular exercise. This is one of the most important things you can do for your health. ? Most adults should exercise for at least 150 minutes each week. The exercise should increase your heart rate and make you sweat (moderate-intensity exercise). ? Most adults should also do strengthening exercises at least twice a week. This is in addition to the moderate-intensity exercise.  Maintain a healthy weight  Body mass index (BMI) is a measurement that can be used to identify possible weight problems. It estimates body fat based on height and weight. Your health care provider can help determine your BMI and help you achieve or maintain a healthy weight.  For females 40 years of age and older: ? A BMI below 18.5 is considered underweight. ? A BMI of 18.5 to 24.9 is normal. ? A BMI of 25 to 29.9 is considered overweight. ? A BMI of 30 and above is considered obese.  Watch levels of cholesterol and blood lipids  You should start having your blood tested for lipids and cholesterol at 22 years of age, then  have this test every 5 years.  You may need to have your cholesterol levels checked more often if: ? Your lipid or cholesterol levels are high. ? You are older than 22 years of age. ? You are at high risk for heart disease.  Cancer screening Lung Cancer  Lung cancer screening is recommended for adults 42-24 years old who are at high risk for lung cancer because of a history of smoking.  A yearly low-dose CT scan of the lungs is recommended for people who: ? Currently smoke. ? Have quit within the past 15 years. ? Have at least a 30-pack-year history of smoking. A pack year is smoking an average of one pack of cigarettes a day for 1 year.  Yearly screening should continue until it has been 15 years since you quit.  Yearly screening should stop if you develop a health problem that would prevent you from having lung cancer treatment.  Breast Cancer  Practice breast self-awareness. This means understanding how your breasts normally appear and feel.  It also means doing regular breast self-exams. Let your health care provider know about any changes, no matter how small.  If you are in your 20s or 30s, you should have a clinical breast exam (CBE) by a health care provider every 1-3 years as part of a regular health exam.  If you are 24 or older, have a CBE every year. Also consider having a breast X-ray (mammogram) every year.  If you have  a family history of breast cancer, talk to your health care provider about genetic screening.  If you are at high risk for breast cancer, talk to your health care provider about having an MRI and a mammogram every year.  Breast cancer gene (BRCA) assessment is recommended for women who have family members with BRCA-related cancers. BRCA-related cancers include: ? Breast. ? Ovarian. ? Tubal. ? Peritoneal cancers.  Results of the assessment will determine the need for genetic counseling and BRCA1 and BRCA2 testing.  Cervical Cancer Your health  care provider may recommend that you be screened regularly for cancer of the pelvic organs (ovaries, uterus, and vagina). This screening involves a pelvic examination, including checking for microscopic changes to the surface of your cervix (Pap test). You may be encouraged to have this screening done every 3 years, beginning at age 5.  For women ages 22-65, health care providers may recommend pelvic exams and Pap testing every 3 years, or they may recommend the Pap and pelvic exam, combined with testing for human papilloma virus (HPV), every 5 years. Some types of HPV increase your risk of cervical cancer. Testing for HPV may also be done on women of any age with unclear Pap test results.  Other health care providers may not recommend any screening for nonpregnant women who are considered low risk for pelvic cancer and who do not have symptoms. Ask your health care provider if a screening pelvic exam is right for you.  If you have had past treatment for cervical cancer or a condition that could lead to cancer, you need Pap tests and screening for cancer for at least 20 years after your treatment. If Pap tests have been discontinued, your risk factors (such as having a new sexual partner) need to be reassessed to determine if screening should resume. Some women have medical problems that increase the chance of getting cervical cancer. In these cases, your health care provider may recommend more frequent screening and Pap tests.  Colorectal Cancer  This type of cancer can be detected and often prevented.  Routine colorectal cancer screening usually begins at 22 years of age and continues through 22 years of age.  Your health care provider may recommend screening at an earlier age if you have risk factors for colon cancer.  Your health care provider may also recommend using home test kits to check for hidden blood in the stool.  A small camera at the end of a tube can be used to examine your colon  directly (sigmoidoscopy or colonoscopy). This is done to check for the earliest forms of colorectal cancer.  Routine screening usually begins at age 16.  Direct examination of the colon should be repeated every 5-10 years through 22 years of age. However, you may need to be screened more often if early forms of precancerous polyps or small growths are found.  Skin Cancer  Check your skin from head to toe regularly.  Tell your health care provider about any new moles or changes in moles, especially if there is a change in a mole's shape or color.  Also tell your health care provider if you have a mole that is larger than the size of a pencil eraser.  Always use sunscreen. Apply sunscreen liberally and repeatedly throughout the day.  Protect yourself by wearing long sleeves, pants, a wide-brimmed hat, and sunglasses whenever you are outside.  Heart disease, diabetes, and high blood pressure  High blood pressure causes heart disease and increases the risk  of stroke. High blood pressure is more likely to develop in: ? People who have blood pressure in the high end of the normal range (130-139/85-89 mm Hg). ? People who are overweight or obese. ? People who are African American.  If you are 16-41 years of age, have your blood pressure checked every 3-5 years. If you are 72 years of age or older, have your blood pressure checked every year. You should have your blood pressure measured twice-once when you are at a hospital or clinic, and once when you are not at a hospital or clinic. Record the average of the two measurements. To check your blood pressure when you are not at a hospital or clinic, you can use: ? An automated blood pressure machine at a pharmacy. ? A home blood pressure monitor.  If you are between 28 years and 84 years old, ask your health care provider if you should take aspirin to prevent strokes.  Have regular diabetes screenings. This involves taking a blood sample to  check your fasting blood sugar level. ? If you are at a normal weight and have a low risk for diabetes, have this test once every three years after 22 years of age. ? If you are overweight and have a high risk for diabetes, consider being tested at a younger age or more often. Preventing infection Hepatitis B  If you have a higher risk for hepatitis B, you should be screened for this virus. You are considered at high risk for hepatitis B if: ? You were born in a country where hepatitis B is common. Ask your health care provider which countries are considered high risk. ? Your parents were born in a high-risk country, and you have not been immunized against hepatitis B (hepatitis B vaccine). ? You have HIV or AIDS. ? You use needles to inject street drugs. ? You live with someone who has hepatitis B. ? You have had sex with someone who has hepatitis B. ? You get hemodialysis treatment. ? You take certain medicines for conditions, including cancer, organ transplantation, and autoimmune conditions.  Hepatitis C  Blood testing is recommended for: ? Everyone born from [birth year] through 1965. ? Anyone with known risk factors for hepatitis C.  Sexually transmitted infections (STIs)  You should be screened for sexually transmitted infections (STIs) including gonorrhea and chlamydia if: ? You are sexually active and are younger than 22 years of age. ? You are older than 22 years of age and your health care provider tells you that you are at risk for this type of infection. ? Your sexual activity has changed since you were last screened and you are at an increased risk for chlamydia or gonorrhea. Ask your health care provider if you are at risk.  If you do not have HIV, but are at risk, it may be recommended that you take a prescription medicine daily to prevent HIV infection. This is called pre-exposure prophylaxis (PrEP). You are considered at risk if: ? You are sexually active and do not regularly  use condoms or know the HIV status of your partner(s). ? You take drugs by injection. ? You are sexually active with a partner who has HIV.  Talk with your health care provider about whether you are at high risk of being infected with HIV. If you choose to begin PrEP, you should first be tested for HIV. You should then be tested every 3 months for as long as you are taking PrEP. Pregnancy  If you are premenopausal and you may become pregnant, ask your health care provider about preconception counseling.  If you may become pregnant, take 400 to 800 micrograms (mcg) of folic acid every day.  If you want to prevent pregnancy, talk to your health care provider about birth control (contraception). Osteoporosis and menopause  Osteoporosis is a disease in which the bones lose minerals and strength with aging. This can result in serious bone fractures. Your risk for osteoporosis can be identified using a bone density scan.  If you are 28 years of age or older, or if you are at risk for osteoporosis and fractures, ask your health care provider if you should be screened.  Ask your health care provider whether you should take a calcium or vitamin D supplement to lower your risk for osteoporosis.  Menopause may have certain physical symptoms and risks.  Hormone replacement therapy may reduce some of these symptoms and risks. Talk to your health care provider about whether hormone replacement therapy is right for you. Follow these instructions at home:  Schedule regular health, dental, and eye exams.  Stay current with your immunizations.  Do not use any tobacco products including cigarettes, chewing tobacco, or electronic cigarettes.  If you are pregnant, do not drink alcohol.  If you are breastfeeding, limit how much and how often you drink alcohol.  Limit alcohol intake to no more than 1 drink per day for nonpregnant women. One drink equals 12 ounces of beer, 5 ounces of wine, or 1 ounces  of hard liquor.  Do not use street drugs.  Do not share needles.  Ask your health care provider for help if you need support or information about quitting drugs.  Tell your health care provider if you often feel depressed.  Tell your health care provider if you have ever been abused or do not feel safe at home. This information is not intended to replace advice given to you by your health care provider. Make sure you discuss any questions you have with your health care provider. Document Released: 11/11/2010 Document Revised: 10/04/2015 Document Reviewed: 01/30/2015 Elsevier Interactive Patient Education  2018 Elsevier Inc.  Human Papillomavirus Quadrivalent Vaccine suspension for injection What is this medicine? HUMAN PAPILLOMAVIRUS VACCINE (HYOO muhn pap uh LOH muh vahy ruhs vak SEEN) is a vaccine. It is used to prevent infections of four types of the human papillomavirus. In women, the vaccine may lower your risk of getting cervical, vaginal, vulvar, or anal cancer and genital warts. In men, the vaccine may lower your risk of getting genital warts and anal cancer. You cannot get these diseases from the vaccine. This vaccine does not treat these diseases. This medicine may be used for other purposes; ask your health care provider or pharmacist if you have questions. COMMON BRAND NAME(S): Gardasil What should I tell my health care provider before I take this medicine? They need to know if you have any of these conditions: -fever or infection -hemophilia -HIV infection or AIDS -immune system problems -low platelet count -an unusual reaction to Human Papillomavirus Vaccine, yeast, other medicines, foods, dyes, or preservatives -pregnant or trying to get pregnant -breast-feeding How should I use this medicine? This vaccine is for injection in a muscle on your upper arm or thigh. It is given by a health care professional. Dennis Bast will be observed for 15 minutes after each dose. Sometimes,  fainting happens after the vaccine is given. You may be asked to sit or lie down during the 15  minutes. Three doses are given. The second dose is given 2 months after the first dose. The last dose is given 4 months after the second dose. A copy of a Vaccine Information Statement will be given before each vaccination. Read this sheet carefully each time. The sheet may change frequently. Talk to your pediatrician regarding the use of this medicine in children. While this drug may be prescribed for children as young as 40 years of age for selected conditions, precautions do apply. Overdosage: If you think you have taken too much of this medicine contact a poison control center or emergency room at once. NOTE: This medicine is only for you. Do not share this medicine with others. What if I miss a dose? All 3 doses of the vaccine should be given within 6 months. Remember to keep appointments for follow-up doses. Your health care provider will tell you when to return for the next vaccine. Ask your health care professional for advice if you are unable to keep an appointment or miss a scheduled dose. What may interact with this medicine? -other vaccines This list may not describe all possible interactions. Give your health care provider a list of all the medicines, herbs, non-prescription drugs, or dietary supplements you use. Also tell them if you smoke, drink alcohol, or use illegal drugs. Some items may interact with your medicine. What should I watch for while using this medicine? This vaccine may not fully protect everyone. Continue to have regular pelvic exams and cervical or anal cancer screenings as directed by your doctor. The Human Papillomavirus is a sexually transmitted disease. It can be passed by any kind of sexual activity that involves genital contact. The vaccine works best when given before you have any contact with the virus. Many people who have the virus do not have any signs or  symptoms. Tell your doctor or health care professional if you have any reaction or unusual symptom after getting the vaccine. What side effects may I notice from receiving this medicine? Side effects that you should report to your doctor or health care professional as soon as possible: -allergic reactions like skin rash, itching or hives, swelling of the face, lips, or tongue -breathing problems -feeling faint or lightheaded, falls Side effects that usually do not require medical attention (report to your doctor or health care professional if they continue or are bothersome): -cough -dizziness -fever -headache -nausea -redness, warmth, swelling, pain, or itching at site where injected This list may not describe all possible side effects. Call your doctor for medical advice about side effects. You may report side effects to FDA at 1-800-FDA-1088. Where should I keep my medicine? This drug is given in a hospital or clinic and will not be stored at home. NOTE: This sheet is a summary. It may not cover all possible information. If you have questions about this medicine, talk to your doctor, pharmacist, or health care provider.  2018 Elsevier/Gold Standard (2013-06-20 13:14:33)   IF you received an x-ray today, you will receive an invoice from [facility]. Please contact [facility] at [phone] with questions or concerns regarding your invoice.   IF you received labwork today, you will receive an invoice from [facility]. Please contact [facility] at [phone] with questions or concerns regarding your invoice.   Our billing staff will not be able to assist you with questions regarding bills from these companies.  You will be contacted with the lab results as soon as they are available. The fastest way to get  your results is to activate your My Chart account. Instructions are located on the last page of this paperwork. If you have not heard from [place] regarding the results in 2  weeks, please contact this office.

## 2017-09-13 LAB — CMP14+EGFR
ALBUMIN: 4.8 g/dL (ref 3.5–5.5)
ALK PHOS: 68 IU/L (ref 39–117)
ALT: 10 IU/L (ref 0–32)
AST: 19 IU/L (ref 0–40)
Albumin/Globulin Ratio: 1.7 (ref 1.2–2.2)
BUN / CREAT RATIO: 14 (ref 9–23)
BUN: 9 mg/dL (ref 6–20)
Bilirubin Total: 0.2 mg/dL (ref 0.0–1.2)
CALCIUM: 9.7 mg/dL (ref 8.7–10.2)
CO2: 21 mmol/L (ref 20–29)
CREATININE: 0.63 mg/dL (ref 0.57–1.00)
Chloride: 104 mmol/L (ref 96–106)
GFR calc Af Amer: 147 mL/min/{1.73_m2} (ref >59)
GFR, EST NON AFRICAN AMERICAN: 128 mL/min/{1.73_m2} (ref >59)
GLOBULIN, TOTAL: 2.9 g/dL (ref 1.5–4.5)
GLUCOSE: 91 mg/dL (ref 65–99)
Potassium: 4.1 mmol/L (ref 3.5–5.2)
Sodium: 141 mmol/L (ref 134–144)
TOTAL PROTEIN: 7.7 g/dL (ref 6.0–8.5)

## 2017-09-13 LAB — CBC WITH DIFFERENTIAL/PLATELET
BASOS ABS: 0 10*3/uL (ref 0.0–0.2)
Basos: 1 %
EOS (ABSOLUTE): 0.2 10*3/uL (ref 0.0–0.4)
EOS: 4 %
HEMATOCRIT: 42.2 % (ref 34.0–46.6)
Hemoglobin: 13.9 g/dL (ref 11.1–15.9)
IMMATURE GRANS (ABS): 0 10*3/uL (ref 0.0–0.1)
IMMATURE GRANULOCYTES: 0 %
LYMPHS: 42 %
Lymphocytes Absolute: 1.9 10*3/uL (ref 0.7–3.1)
MCH: 28.6 pg (ref 26.6–33.0)
MCHC: 32.9 g/dL (ref 31.5–35.7)
MCV: 87 fL (ref 79–97)
Monocytes Absolute: 0.3 10*3/uL (ref 0.1–0.9)
Monocytes: 6 %
Neutrophils Absolute: 2.1 10*3/uL (ref 1.4–7.0)
Neutrophils: 47 %
Platelets: 343 10*3/uL (ref 150–379)
RBC: 4.86 x10E6/uL (ref 3.77–5.28)
RDW: 13.7 % (ref 12.3–15.4)
WBC: 4.5 10*3/uL (ref 3.4–10.8)

## 2017-09-13 LAB — LIPID PANEL
Chol/HDL Ratio: 3.5 ratio (ref 0.0–4.4)
Cholesterol, Total: 177 mg/dL (ref 100–199)
HDL: 51 mg/dL (ref >39)
LDL CALC: 103 mg/dL — AB (ref 0–99)
Triglycerides: 115 mg/dL (ref 0–149)
VLDL CHOLESTEROL CAL: 23 mg/dL (ref 5–40)

## 2017-09-13 LAB — TSH: TSH: 1.61 u[IU]/mL (ref 0.450–4.500)

## 2017-09-16 LAB — PAP IG, CT-NG, RFX HPV ASCU
Chlamydia, Nuc. Acid Amp: NEGATIVE
GONOCOCCUS BY NUCLEIC ACID AMP: NEGATIVE
PAP Smear Comment: 0

## 2017-09-25 ENCOUNTER — Encounter: Payer: Self-pay | Admitting: Family Medicine

## 2017-09-30 ENCOUNTER — Encounter: Payer: Self-pay | Admitting: Family Medicine

## 2018-12-21 ENCOUNTER — Encounter: Payer: PRIVATE HEALTH INSURANCE | Admitting: Obstetrics and Gynecology

## 2018-12-24 ENCOUNTER — Encounter: Payer: Self-pay | Admitting: Certified Nurse Midwife

## 2018-12-24 ENCOUNTER — Ambulatory Visit: Payer: PRIVATE HEALTH INSURANCE | Admitting: Certified Nurse Midwife

## 2018-12-24 ENCOUNTER — Other Ambulatory Visit: Payer: Self-pay

## 2018-12-24 VITALS — BP 116/70 | HR 68 | Temp 97.2°F | Resp 16 | Ht 64.75 in | Wt 126.0 lb

## 2018-12-24 DIAGNOSIS — Z01419 Encounter for gynecological examination (general) (routine) without abnormal findings: Secondary | ICD-10-CM | POA: Diagnosis not present

## 2018-12-24 DIAGNOSIS — N898 Other specified noninflammatory disorders of vagina: Secondary | ICD-10-CM

## 2018-12-24 NOTE — Progress Notes (Signed)
23 y.o. G0P0000 Single  Caucasian Fe here to establish gyn care and  for annual exam. She also is complaining of vaginal itching off and on. No new personal products. She does have sensitive skin.  Periods  monthly  5-6 days duration with day 1-2 heavy and then light, cramping uses OTC with good results. Pap smear last year with another provider, results negative( in epic). Desires STD screening, no concerns. No other health issues today.  Patient's last menstrual period was 12/08/2018 (exact date).          Sexually active: No.never sexually active  The current method of family planning is abstinence.    Exercising: Yes.    running Smoker:  no  Review of Systems  Constitutional: Negative.   HENT: Negative.   Eyes: Negative.   Respiratory: Negative.   Cardiovascular: Negative.   Gastrointestinal: Negative.   Genitourinary: Negative.   Musculoskeletal: Negative.   Skin: Negative.        External vaginal itching  Neurological: Negative.   Endo/Heme/Allergies: Negative.   Psychiatric/Behavioral: Negative.     Health Maintenance: Pap:  09-12-17 neg History of Abnormal Pap: no MMG:  none Self Breast exams: no Colonoscopy: none BMD:   none TDaP:  2015 Shingles: no Pneumonia: no Hep C and HIV: HIV neg 2017 Labs: if needed   reports that she has never smoked. She has never used smokeless tobacco. She reports that she does not drink alcohol or use drugs.  No past medical history on file.  No past surgical history on file.  No current outpatient medications on file.   No current facility-administered medications for this visit.     Family History  Problem Relation Age of Onset  . Hypothyroidism Mother   . Asthma Father     ROS:  Pertinent items are noted in HPI.  Otherwise, a comprehensive ROS was negative.  Exam:   BP 116/70   Pulse 68   Temp (!) 97.2 F (36.2 C) (Skin)   Resp 16   Ht 5' 4.75" (1.645 m)   Wt 126 lb (57.2 kg)   LMP 12/08/2018 (Exact Date)   BMI  21.13 kg/m  Height: 5' 4.75" (164.5 cm) Ht Readings from Last 3 Encounters:  12/24/18 5' 4.75" (1.645 m)  09/12/17 5\' 5"  (1.651 m)  11/06/15 5\' 6"  (1.676 m)    General appearance: alert, cooperative and appears stated age Head: Normocephalic, without obvious abnormality, atraumatic Neck: no adenopathy, supple, symmetrical, trachea midline and thyroid normal to inspection and palpation Lungs: clear to auscultation bilaterally Breasts: normal appearance, no masses or tenderness, No nipple retraction or dimpling, No nipple discharge or bleeding, No axillary or supraclavicular adenopathy Heart: regular rate and rhythm Abdomen: soft, non-tender; no masses,  no organomegaly Extremities: extremities normal, atraumatic, no cyanosis or edema Skin: Skin color, texture, turgor normal. No rashes or lesions Lymph nodes: Cervical, supraclavicular, and axillary nodes normal. No abnormal inguinal nodes palpated Neurologic: Grossly normal   Pelvic: External genitalia:  no lesions, normal female              Urethra:  normal appearing urethra with no masses, tenderness or lesions              Bartholin's and Skene's: normal                 Vagina: normal appearing vagina with normal color and discharge, no lesions              Cervix: no cervical  motion tenderness, no lesions, nulliparous appearance and normal appearance              Pap taken: No. Bimanual Exam:  Uterus:  normal size, contour, position, consistency, mobility, non-tender and anteverted              Adnexa: normal adnexa and no mass, fullness, tenderness               Rectovaginal: Confirms               Anus:  normal sphincter tone, no lesions  Chaperone present: yes  A:  Well Woman with normal exam  Contraception none, not sexually active  Vaginal and vulva itching, R/O yeast vulvitis vs dryness  P:   Reviewed health and wellness pertinent to exam  Will advise if this changes and needs contraception.  Discussed vulva dryness  and Aveeno oatmeal bath and coconut oil for dryness to see if this resolves. Will treat per affirm. Questions addressed.  Pap smear: no  counseled on breast self exam, STD prevention, HIV risk factors and prevention, adequate intake of calcium and vitamin D, diet and exercise  return annually or prn  An After Visit Summary was printed and given to the patient.

## 2018-12-24 NOTE — Patient Instructions (Signed)
General topics  Next pap or exam is  due in 1 year Take a Women's multivitamin Take 1200 mg. of calcium daily - prefer dietary If any concerns in interim to call back  Breast Self-Awareness Practicing breast self-awareness may pick up problems early, prevent significant medical complications, and possibly save your life. By practicing breast self-awareness, you can become familiar with how your breasts look and feel and if your breasts are changing. This allows you to notice changes early. It can also offer you some reassurance that your breast health is good. One way to learn what is normal for your breasts and whether your breasts are changing is to do a breast self-exam. If you find a lump or something that was not present in the past, it is best to contact your caregiver right away. Other findings that should be evaluated by your caregiver include nipple discharge, especially if it is bloody; skin changes or reddening; areas where the skin seems to be pulled in (retracted); or new lumps and bumps. Breast pain is seldom associated with cancer (malignancy), but should also be evaluated by a caregiver. BREAST SELF-EXAM The best time to examine your breasts is 5 7 days after your menstrual period is over.  ExitCare Patient Information 2013 [place].   Exercise to Stay Healthy Exercise helps you become and stay healthy. EXERCISE IDEAS AND TIPS Choose exercises that:  You enjoy.  Fit into your day. You do not need to exercise really hard to be healthy. You can do exercises at a slow or medium level and stay healthy. You can:  Stretch before and after working out.  Try yoga, Pilates, or tai chi.  Lift weights.  Walk fast, swim, jog, run, climb stairs, bicycle, dance, or rollerskate.  Take aerobic classes. Exercises that burn about 150 calories:  Running 1  miles in 15 minutes.  Playing volleyball for 45 to 60 minutes.  Washing and waxing a car for 45 to 60  minutes.  Playing touch football for 45 minutes.  Walking 1  miles in 35 minutes.  Pushing a stroller 1  miles in 30 minutes.  Playing basketball for 30 minutes.  Raking leaves for 30 minutes.  Bicycling 5 miles in 30 minutes.  Walking 2 miles in 30 minutes.  Dancing for 30 minutes.  Shoveling snow for 15 minutes.  Swimming laps for 20 minutes.  Walking up stairs for 15 minutes.  Bicycling 4 miles in 15 minutes.  Gardening for 30 to 45 minutes.  Jumping rope for 15 minutes.  Washing windows or floors for 45 to 60 minutes. Document Released: 05/31/2010 Document Revised: 07/21/2011 Document Reviewed: 05/31/2010 [facility] Patient Information 2013 [place].   Other topics ( that may be useful information):    Sexually Transmitted Disease Sexually transmitted disease (STD) refers to any infection that is passed from person to person during sexual activity. This may happen by way of saliva, semen, blood, vaginal mucus, or urine. Common STDs include:  Gonorrhea.  Chlamydia.  Syphilis.  HIV/AIDS.  Genital herpes.  Hepatitis B and C.  Trichomonas.  Human papillomavirus (HPV).  Pubic lice. CAUSES  An STD may be spread by bacteria, virus, or parasite. A person can get an STD by:  Sexual intercourse with an infected person.  Sharing sex toys with an infected person.  Sharing needles with an infected person.  Having intimate contact with the genitals, mouth, or rectal areas of an infected person. SYMPTOMS  Some people may  they can still pass the infection to others. Different STDs have different symptoms. Symptoms include: °· Painful or bloody urination. °· Pain in the pelvis, abdomen, vagina, anus, throat, or eyes. °· Skin rash, itching, irritation, growths, or sores (lesions). These usually occur in the genital or anal area. °· Abnormal vaginal discharge. °· Penile discharge in men. °· Soft, flesh-colored skin growths in the  genital or anal area. °· Fever. °· Pain or bleeding during sexual intercourse. °· Swollen glands in the groin area. °· Yellow skin and eyes (jaundice). This is seen with hepatitis. °DIAGNOSIS  °To make a diagnosis, your caregiver may: °· Take a medical history. °· Perform a physical exam. °· Take a specimen (culture) to be examined. °· Examine a sample of discharge under a microscope. °· Perform blood test °TREATMENT  °· Chlamydia, gonorrhea, trichomonas, and syphilis can be cured with antibiotic medicine. °· Genital herpes, hepatitis, and HIV can be treated, but not cured, with prescribed medicines. The medicines will lessen the symptoms. °· Genital warts from HPV can be treated with medicine or by freezing, burning (electrocautery), or surgery. Warts may come back. °· HPV is a virus and cannot be cured with medicine or surgery. However, abnormal areas may be followed very closely by your caregiver and may be removed from the cervix, vagina, or vulva through office procedures or surgery. °If your diagnosis is confirmed, your recent sexual partners need treatment. This is true even if they are symptom-free or have a negative culture or evaluation. They should not have sex until their caregiver says it is okay. °HOME CARE INSTRUCTIONS °· All sexual partners should be informed, tested, and treated for all STDs. °· Take your antibiotics as directed. Finish them even if you start to feel better. °· Only take over-the-counter or prescription medicines for pain, discomfort, or fever as directed by your caregiver. °· Rest. °· Eat a balanced diet and drink enough fluids to keep your urine clear or pale yellow. °· Do not have sex until treatment is completed and you have followed up with your caregiver. STDs should be checked after treatment. °· Keep all follow-up appointments, Pap tests, and blood tests as directed by your caregiver. °· Only use latex condoms and water-soluble lubricants during sexual activity. Do not use  petroleum jelly or oils. °· Avoid alcohol and illegal drugs. °· Get vaccinated for HPV and hepatitis. If you have not received these vaccines in the past, talk to your caregiver about whether one or both might be right for you. °· Avoid risky sex practices that can break the skin. °The only way to avoid getting an STD is to avoid all sexual activity. Latex condoms and dental dams (for oral sex) will help lessen the risk of getting an STD, but will not completely eliminate the risk. °SEEK MEDICAL CARE IF:  °· You have a fever. °· You have any new or worsening symptoms. °Document Released: 07/19/2002 Document Revised: 07/21/2011 Document Reviewed: 07/26/2010 °ExitCare® Patient Information ©2013 [employer]. ° ° ° °Domestic Abuse °You are being battered or abused if someone close to you hits, pushes, or physically hurts you in any way. You also are being abused if you are forced into activities. You are being sexually abused if you are forced to have sexual contact of any kind. You are being emotionally abused if you are made to feel worthless or if you are constantly threatened. It is important to remember that help is available. No one has the right to abuse you. °PREVENTION OF FURTHER   abuse you. PREVENTION OF FURTHER ABUSE  Learn the warning signs of danger. This varies with situations but may include: the use of alcohol, threats, isolation from friends and family, or forced sexual contact. Leave if you feel that violence is going to occur.  If you are attacked or beaten, report it to the police so the abuse is documented. You do not have to press charges. The police can protect you while you or the attackers are leaving. Get the officer's name and badge number and a copy of the report.  Find someone you can trust and tell them what is happening to you: your caregiver, a nurse, clergy member, close friend or family member. Feeling ashamed is natural, but remember that you have done nothing wrong. No one deserves abuse. Document Released:  04/25/2000 Document Revised: 07/21/2011 Document Reviewed: 07/04/2010 [facility] Patient Information 2013 [place].    How Much is Too Much Alcohol? Drinking too much alcohol can cause injury, accidents, and health problems. These types of problems can include:   Car crashes.  Falls.  Family fighting (domestic violence).  Drowning.  Fights.  Injuries.  Burns.  Damage to certain organs.  Having a baby with birth defects. ONE DRINK CAN BE TOO MUCH WHEN YOU ARE:  Working.  Pregnant or breastfeeding.  Taking medicines. Ask your doctor.  Driving or planning to drive. If you or someone you know has a drinking problem, get help from a doctor.  Document Released: 02/22/2009 Document Revised: 07/21/2011 Document Reviewed: 02/22/2009 [facility] Patient Information 2013 [place].   Smoking Hazards Smoking cigarettes is extremely bad for your health. Tobacco smoke has over 200 known poisons in it. There are over 60 chemicals in tobacco smoke that cause cancer. Some of the chemicals found in cigarette smoke include:   Cyanide.  Benzene.  Formaldehyde.  Methanol (wood alcohol).  Acetylene (fuel used in welding torches).  Ammonia. Cigarette smoke also contains the poisonous gases nitrogen oxide and carbon monoxide.  Cigarette smokers have an increased risk of many serious medical problems and Smoking causes approximately:  90% of all lung cancer deaths in men.  80% of all lung cancer deaths in women.  90% of deaths from chronic obstructive lung disease. Compared with nonsmokers, smoking increases the risk of:  Coronary heart disease by 2 to 4 times.  Stroke by 2 to 4 times.  Men developing lung cancer by 23 times.  Women developing lung cancer by 13 times.  Dying from chronic obstructive lung diseases by 12 times.  . Smoking is the most preventable cause of death and disease in our society.  WHY IS SMOKING ADDICTIVE?  Nicotine is the chemical  agent in tobacco that is capable of causing addiction or dependence.  When you smoke and inhale, nicotine is absorbed rapidly into the bloodstream through your lungs. Nicotine absorbed through the lungs is capable of creating a powerful addiction. Both inhaled and non-inhaled nicotine may be addictive.  Addiction studies of cigarettes and spit tobacco show that addiction to nicotine occurs mainly during the teen years, when young people begin using tobacco products. WHAT ARE THE BENEFITS OF QUITTING?  There are many health benefits to quitting smoking.   Likelihood of developing cancer and heart disease decreases. Health improvements are seen almost immediately.  Blood pressure, pulse rate, and breathing patterns start returning to normal soon after quitting. QUITTING SMOKING   American Lung Association - 1-800-LUNGUSA  American Cancer Society - 1-800-ACS-2345 Document Released: 06/05/2004 Document Revised: 07/21/2011 Document Reviewed: 02/07/2009  LLC.   Stress Management Stress is a state of physical or mental tension that often results from changes in your life or normal routine. Some common causes of stress are:  Death of a loved one.  Injuries or severe illnesses.  Getting fired or changing jobs.  Moving into a new home. Other causes may be:  Sexual problems.  Business or financial losses.  Taking on a large debt.  Regular conflict with someone at home or at work.  Constant tiredness from lack of sleep. It is not just bad things that are stressful. It may be stressful to:  Win the lottery.  Get married.  Buy a new car. The amount of stress that can be easily tolerated varies from person to person. Changes generally cause stress, regardless of the types of change. Too much stress can affect your health. It may lead to physical or emotional problems. Too little stress (boredom) may also become stressful. SUGGESTIONS TO  REDUCE STRESS:  Talk things over with your family and friends. It often is helpful to share your concerns and worries. If you feel your problem is serious, you may want to get help from a professional counselor.  Consider your problems one at a time instead of lumping them all together. Trying to take care of everything at once may seem impossible. List all the things you need to do and then start with the most important one. Set a goal to accomplish 2 or 3 things each day. If you expect to do too many in a single day you will naturally fail, causing you to feel even more stressed.  Do not use alcohol or drugs to relieve stress. Although you may feel better for a short time, they do not remove the problems that caused the stress. They can also be habit forming.  Exercise regularly - at least 3 times per week. Physical exercise can help to relieve that "uptight" feeling and will relax you.  The shortest distance between despair and hope is often a good night's sleep.  Go to bed and get up on time allowing yourself time for appointments without being rushed.  Take a short "time-out" period from any stressful situation that occurs during the day. Close your eyes and take some deep breaths. Starting with the muscles in your face, tense them, hold it for a few seconds, then relax. Repeat this with the muscles in your neck, shoulders, hand, stomach, back and legs.  Take good care of yourself. Eat a balanced diet and get plenty of rest.  Schedule time for having fun. Take a break from your daily routine to relax. HOME CARE INSTRUCTIONS   Call if you feel overwhelmed by your problems and feel you can no longer manage them on your own.  Return immediately if you feel like hurting yourself or someone else. Document Released: 10/22/2000 Document Revised: 07/21/2011 Document Reviewed: 06/14/2007 ExitCare Patient Information 2013 [employer].   

## 2018-12-25 LAB — VAGINITIS/VAGINOSIS, DNA PROBE
Candida Species: NEGATIVE
Gardnerella vaginalis: NEGATIVE
Trichomonas vaginosis: NEGATIVE

## 2019-05-13 DIAGNOSIS — E559 Vitamin D deficiency, unspecified: Secondary | ICD-10-CM

## 2019-05-13 HISTORY — DX: Vitamin D deficiency, unspecified: E55.9

## 2019-07-25 ENCOUNTER — Encounter: Payer: Self-pay | Admitting: Certified Nurse Midwife

## 2019-07-27 ENCOUNTER — Encounter: Payer: Self-pay | Admitting: Certified Nurse Midwife

## 2020-05-29 ENCOUNTER — Ambulatory Visit: Payer: PRIVATE HEALTH INSURANCE | Admitting: Obstetrics and Gynecology

## 2020-06-11 ENCOUNTER — Encounter: Payer: Self-pay | Admitting: Obstetrics and Gynecology

## 2020-06-11 ENCOUNTER — Other Ambulatory Visit (HOSPITAL_COMMUNITY)
Admission: RE | Admit: 2020-06-11 | Discharge: 2020-06-11 | Disposition: A | Discharge status: Home / Self Care | Payer: PRIVATE HEALTH INSURANCE | Source: Ambulatory Visit | Attending: Obstetrics and Gynecology | Admitting: Obstetrics and Gynecology

## 2020-06-11 ENCOUNTER — Ambulatory Visit (INDEPENDENT_AMBULATORY_CARE_PROVIDER_SITE_OTHER): Payer: PRIVATE HEALTH INSURANCE | Admitting: Obstetrics and Gynecology

## 2020-06-11 ENCOUNTER — Other Ambulatory Visit: Payer: Self-pay

## 2020-06-11 VITALS — BP 108/74 | HR 112 | Ht 64.5 in | Wt 119.0 lb

## 2020-06-11 DIAGNOSIS — Z01419 Encounter for gynecological examination (general) (routine) without abnormal findings: Secondary | ICD-10-CM

## 2020-06-11 DIAGNOSIS — N898 Other specified noninflammatory disorders of vagina: Secondary | ICD-10-CM | POA: Diagnosis not present

## 2020-06-11 LAB — WET PREP FOR TRICH, YEAST, CLUE

## 2020-06-11 MED ORDER — TRIAMCINOLONE ACETONIDE 0.025 % EX OINT: 1.0000 "application " | TOPICAL_OINTMENT | Freq: Two times a day (BID) | CUTANEOUS | 1 refills | Status: DC

## 2020-06-11 MED ORDER — FLUCONAZOLE 150 MG PO TABS
150.0000 mg | ORAL_TABLET | Freq: Once | ORAL | 0 refills | Status: AC
Start: 2020-06-11 — End: 2020-06-11

## 2020-06-11 NOTE — Progress Notes (Signed)
25 y.o. G0P0000 Single Caucasian female here for annual exam.    Patient complaining of external vaginal itching, which is persistent.  Coconut oil is not helpful. Has tried Monistat, which did not help for long.   No internal itching.  No discharge or odor.   Using [employer] and [employer].  Normal glucose with PCP.   Hx morphea.  Saw dermatology about 3 years ago.  No treatment of this.   Cramping with cycles manageable with Advil.   Not SA ever.   Worked in [employer] in past.   No Covid vaccine.  Had Covid in the past.   PCP:  [facility]--Katherine Hemberg, MD  Patient's last menstrual period was 05/22/2020 (exact date).     Period Cycle (Days): 30 Period Duration (Days): 5-6 days Period Pattern: Regular Menstrual Flow: Moderate Menstrual Control: Maxi pad Menstrual Control Change Freq (Hours): changes pad every 4-5 hours on heaviest day Dysmenorrhea: (!) Moderate (cramps only on first day) Dysmenorrhea Symptoms: Cramping (occ nausea)     Sexually active: No.  The current method of family planning is abstinence.    Exercising: No.  The patient does not participate in regular exercise at present. Smoker:  no  Health Maintenance: Pap: 09-12-17 Neg History of abnormal Pap:  no MMG:  n/a Colonoscopy:  n/a BMD:   n/a  Result  n/a TDaP: 2015 Gardasil:   no HIV: 10-04-15 NR Hep C: no Screening Labs:  PCP.    reports that she has never smoked. She has never used smokeless tobacco. She reports that she does not drink alcohol and does not use drugs.  Past Medical History:  Diagnosis Date  . Vitamin D deficiency 2021    History reviewed. No pertinent surgical history.  Current Outpatient Medications  Medication Sig Dispense Refill  . Vitamin D, Ergocalciferol, (DRISDOL) 1.25 MG (50000 UNIT) CAPS capsule Take 50,000 Units by mouth once a week.     No current facility-administered medications for this visit.    Family History  Problem Relation Age of Onset   . Thyroid disease Mother     Review of Systems  All other systems reviewed and are negative.   Exam:   BP 108/74   Pulse (!) 112   Ht 5' 4.5" (1.638 m)   Wt 119 lb (54 kg)   LMP 05/22/2020 (Exact Date)   SpO2 100%   BMI 20.11 kg/m     General appearance: alert, cooperative and appears stated age Head: normocephalic, without obvious abnormality, atraumatic Neck: no adenopathy, supple, symmetrical, trachea midline and thyroid normal to inspection and palpation Lungs: clear to auscultation bilaterally Breasts: normal appearance, no masses or tenderness, No nipple retraction or dimpling, No nipple discharge or bleeding, No axillary adenopathy Heart: regular rate and rhythm Abdomen: soft, non-tender; no masses, no organomegaly Extremities: extremities normal, atraumatic, no cyanosis or edema Skin: skin color, texture, turgor normal.  Brown-pink areas of thin skin on extremities and trunk.  Lymph nodes: cervical, supraclavicular, and axillary nodes normal. Neurologic: grossly normal  Pelvic: External genitalia:  no lesions              No abnormal inguinal nodes palpated.              Urethra:  normal appearing urethra with no masses, tenderness or lesions              Bartholins and Skenes: normal  Vagina: normal appearing vagina with normal color and small amount of white creamy discharge, no lesions              Cervix: no lesions              Pap taken: Yes.   Bimanual Exam:  Uterus:  normal size, contour, position, consistency, mobility, non-tender              Adnexa: no mass, fullness, tenderness             Chaperone was present for exam.  Assessment:   Well woman visit with normal exam. Low vit D.  Sensitive to dairy products.  Vaginal itching. Morphea.  Plan: Mammogram screening age 63.   Self breast awareness reviewed. Pap and HR HPV as above. Guidelines for Calcium, Vitamin D, regular exercise program including cardiovascular and weight  bearing exercise. Wet prep:  Yeast present.  No clue cells or trichomonas.  Diflucan 150 mg po x 1. May repeat in 72 hours prn.  Triamcinolone ointment 0.25% to vulva bid x 1 - 2 weeks prn.   Morphea review done with patient in Up To Date.  Materials printed.   She will let me know if she would like to see a [facility] dermatologist.  We reviewed Covid vaccination.  Follow up annually and prn.

## 2020-06-11 NOTE — Patient Instructions (Signed)

## 2020-06-12 LAB — CYTOLOGY - PAP: Diagnosis: NEGATIVE

## 2021-07-30 NOTE — Progress Notes (Signed)
26 y.o. G0P0000 Single Caucasian female here for annual exam.   ? ?Still having some itching every few days around the vaginal opening.  ?No unusual discharge.  ?No odor.  ?Wants evaluation.  ? ?Not sexually active.  ? ?No use of new products.  ? ?She has been treated with Diflucan and triamcinolone, which helped.  ? ?Also has some skin tags that she would like to have removed. ? ?PCP:   Tenna Delaine, PA-C. ? ?Patient's last menstrual period was 07/04/2021.     ?Period Cycle (Days): 28 ?Period Duration (Days): 6 ?Period Pattern: Regular ?Menstrual Flow: Moderate ?Dysmenorrhea: (!) Moderate ?Dysmenorrhea Symptoms: Cramping ?    ?Sexually active: No.  ?The current method of family planning is abstinence.    ?Exercising: Yes.    Home exercise.  ?Smoker:  no ? ?Health Maintenance: ?Pap: 06-11-20 Neg, 09-12-17 Neg ?History of abnormal Pap:  no ?MMG:  N/A ?Colonoscopy:  N/A ?BMD:   N/A  Result   ?TDaP:  2015 ?Gardasil:  .  Declines.  ?HIV: 10-04-15 Neg ?Hep C: no ?Screening Labs:  Hb today: PCP, Urine today: PCP ? ? reports that she has never smoked. She has never used smokeless tobacco. She reports that she does not drink alcohol and does not use drugs. ? ?Past Medical History:  ?Diagnosis Date  ? Morphea   ? dx in elementary school  ? Vitamin D deficiency 2021  ? ? ?History reviewed. No pertinent surgical history. ? ?Current Outpatient Medications  ?Medication Sig Dispense Refill  ? Vitamin D, Ergocalciferol, (DRISDOL) 1.25 MG (50000 UNIT) CAPS capsule Take 50,000 Units by mouth once a week.    ? ?No current facility-administered medications for this visit.  ? ? ?Family History  ?Problem Relation Age of Onset  ? Thyroid disease Mother   ? ? ?Review of Systems  ?All other systems reviewed and are negative. ? ?Exam:   ?BP 116/70 (BP Location: Right Arm, Patient Position: Sitting, Cuff Size: Normal)   Pulse 86   Ht 5\' 5"  (1.651 m)   Wt 133 lb (60.3 kg)   LMP 07/04/2021   SpO2 99%   BMI 22.13 kg/m?     ?General  appearance: alert, cooperative and appears stated age ?Head: normocephalic, without obvious abnormality, atraumatic ?Neck: no adenopathy, supple, symmetrical, trachea midline and thyroid normal to inspection and palpation ?Lungs: clear to auscultation bilaterally ?Breasts: normal appearance, no masses or tenderness, No nipple retraction or dimpling, No nipple discharge or bleeding, No axillary adenopathy ?Heart: regular rate and rhythm ?Abdomen: soft, non-tender; no masses, no organomegaly ?Extremities: extremities normal, atraumatic, no cyanosis or edema ?Skin: skin color, texture, turgor normal. Areas of skin hyperpigmentation.  ?Lymph nodes: cervical, supraclavicular, and axillary nodes normal. ?Neurologic: grossly normal ? ?Pelvic: External genitalia:  right mons and right medial thigh near crural fold with 3 mm lesions.  ?             No abnormal inguinal nodes palpated. ?             Urethra:  normal appearing urethra with no masses, tenderness or lesions ?             Bartholins and Skenes: normal    ?             Vagina: normal appearing vagina with normal color and discharge, no lesions ?             Cervix: no lesions ?  Pap taken: no ?Bimanual Exam:  Uterus:  normal size, contour, position, consistency, mobility, non-tender ?             Adnexa: no mass, fullness, tenderness ?          ?Chaperone was present for exam:  Onalee Hua. CMA ? ?Assessment:   ?Well woman visit with gynecologic exam. ?Chronic vulvitis. ?Vulvar skin lesions.  ?Morphea. ?Low vit D.  ?Sensitive to dairy products.  ? ?Plan: ?Mammogram screening discussed. ?Self breast awareness reviewed. ?Pap and HR HPV as above. ?Guidelines for Calcium, Vitamin D, regular exercise program including cardiovascular and weight bearing exercise. ?Wet prep: negative ?Rx for Triamcinolone 0.25% ointment.  Instructed in use.  ?Return for vulvar biopsies of lesions and of irritated area. ?Information on HPV vaccine.  ?Follow up annually and prn.   ? ?After visit summary provided.  ? ?In addition to annual exam, patient was evaluated and treated for chronic vulvitis.  ? ? ? ? ? ?

## 2021-07-31 ENCOUNTER — Other Ambulatory Visit: Payer: Self-pay

## 2021-07-31 ENCOUNTER — Ambulatory Visit (INDEPENDENT_AMBULATORY_CARE_PROVIDER_SITE_OTHER): Payer: 59 | Admitting: Obstetrics and Gynecology

## 2021-07-31 ENCOUNTER — Encounter: Payer: Self-pay | Admitting: Obstetrics and Gynecology

## 2021-07-31 VITALS — BP 116/70 | HR 86 | Ht 65.0 in | Wt 133.0 lb

## 2021-07-31 DIAGNOSIS — N763 Subacute and chronic vulvitis: Secondary | ICD-10-CM | POA: Diagnosis not present

## 2021-07-31 DIAGNOSIS — Z01419 Encounter for gynecological examination (general) (routine) without abnormal findings: Secondary | ICD-10-CM

## 2021-07-31 DIAGNOSIS — N9089 Other specified noninflammatory disorders of vulva and perineum: Secondary | ICD-10-CM

## 2021-07-31 LAB — WET PREP FOR TRICH, YEAST, CLUE

## 2021-07-31 MED ORDER — TRIAMCINOLONE ACETONIDE 0.025 % EX OINT
1.0000 | TOPICAL_OINTMENT | Freq: Two times a day (BID) | CUTANEOUS | 1 refills | Status: AC
Start: 2021-07-31 — End: ?

## 2021-07-31 NOTE — Patient Instructions (Signed)
EXERCISE AND DIET:  We recommended that you start or continue a regular exercise program for good health. Regular exercise means any activity that makes your heart beat faster and makes you sweat.  We recommend exercising at least 30 minutes per day at least 3 days a week, preferably 4 or 5.  We also recommend a diet low in fat and sugar.  Inactivity, poor dietary choices and obesity can cause diabetes, heart attack, stroke, and kidney damage, among others.   ? ?ALCOHOL AND SMOKING:  Women should limit their alcohol intake to no more than 7 drinks/beers/glasses of wine (combined, not each!) per week. Moderation of alcohol intake to this level decreases your risk of breast cancer and liver damage. And of course, no recreational drugs are part of a healthy lifestyle.  And absolutely no smoking or even second hand smoke. Most people know smoking can cause heart and lung diseases, but did you know it also contributes to weakening of your bones? Aging of your skin?  Yellowing of your teeth and nails? ? ?CALCIUM AND VITAMIN D:  Adequate intake of calcium and Vitamin D are recommended.  The recommendations for exact amounts of these supplements seem to change often, but generally speaking 600 mg of calcium (either carbonate or citrate) and 800 units of Vitamin D per day seems prudent. Certain women may benefit from higher intake of Vitamin D.  If you are among these women, your doctor will have told you during your visit.   ? ?PAP SMEARS:  Pap smears, to check for cervical cancer or precancers,  have traditionally been done yearly, although recent scientific advances have shown that most women can have pap smears less often.  However, every woman still should have a physical exam from her gynecologist every year. It will include a breast check, inspection of the vulva and vagina to check for abnormal growths or skin changes, a visual exam of the cervix, and then an exam to evaluate the size and shape of the uterus and  ovaries.  And after 26 years of age, a rectal exam is indicated to check for rectal cancers. We will also provide age appropriate advice regarding health maintenance, like when you should have certain vaccines, screening for sexually transmitted diseases, bone density testing, colonoscopy, mammograms, etc.  ? ?MAMMOGRAMS:  All women over 45 years old should have a yearly mammogram. Many facilities now offer a "3D" mammogram, which may cost around $50 extra out of pocket. If possible,  we recommend you accept the option to have the 3D mammogram performed.  It both reduces the number of women who will be called back for extra views which then turn out to be normal, and it is better than the routine mammogram at detecting truly abnormal areas.   ? ?COLONOSCOPY:  Colonoscopy to screen for colon cancer is recommended for all women at age 18.  We know, you hate the idea of the prep.  We agree, BUT, having colon cancer and not knowing it is worse!!  Colon cancer so often starts as a polyp that can be seen and removed at colonscopy, which can quite literally save your life!  And if your first colonoscopy is normal and you have no family history of colon cancer, most women don't have to have it again for 10 years.  Once every ten years, you can do something that may end up saving your life, right?  We will be happy to help you get it scheduled when you are ready.  Be sure to check your insurance coverage so you understand how much it will cost.  It may be covered as a preventative service at no cost, but you should check your particular policy.   ? ?HPV (Human Papillomavirus) Vaccine: What You Need to Know ?1. Why get vaccinated? ?HPV (human papillomavirus) vaccine can prevent infection with some types of human papillomavirus. ?HPV infections can cause certain types of cancers, including: ?cervical, vaginal, and vulvar cancers in women ?penile cancer in men ?anal cancers in both men and women ?cancers of tonsils, base of  tongue, and back of throat (oropharyngeal cancer) in both men and women ?HPV infections can also cause anogenital warts. ?HPV vaccine can prevent over 90% of cancers caused by HPV. ?HPV is spread through intimate skin-to-skin or sexual contact. HPV infections are so common that nearly all people will get at least one type of HPV at some time in their lives. Most HPV infections go away on their own within 2 years. But sometimes HPV infections will last longer and can cause cancers later in life. ?2. HPV vaccine ?HPV vaccine is routinely recommended for adolescents at 28 or 26 years of age to ensure they are protected before they are exposed to the virus. HPV vaccine may be given beginning at age 23 years and vaccination is recommended for everyone through 26 years of age. ?HPV vaccine may be given to adults 27 through 27 years of age, based on discussions between the patient and health care provider. ?Most children who get the first dose before 58 years of age need 2 doses of HPV vaccine. People who get the first dose at or after 6 years of age and younger people with certain immunocompromising conditions need 3 doses. Your health care provider can give you more information. ?HPV vaccine may be given at the same time as other vaccines. ?3. Talk with your health care provider ?Tell your vaccination provider if the person getting the vaccine: ?Has had an allergic reaction after a previous dose of HPV vaccine, or has any severe, life-threatening allergies ?Is pregnant--HPV vaccine is not recommended until after pregnancy ?In some cases, your health care provider may decide to postpone HPV vaccination until a future visit. ?People with minor illnesses, such as a cold, may be vaccinated. People who are moderately or severely ill should usually wait until they recover before getting HPV vaccine. ?Your health care provider can give you more information. ?4. Risks of a vaccine reaction ?Soreness, redness, or swelling where  the shot is given can happen after HPV vaccination. ?Fever or headache can happen after HPV vaccination. ?People sometimes faint after medical procedures, including vaccination. Tell your provider if you feel dizzy or have vision changes or ringing in the ears. ?As with any medicine, there is a very remote chance of a vaccine causing a severe allergic reaction, other serious injury, or death. ?5. What if there is a serious problem? ?An allergic reaction could occur after the vaccinated person leaves the clinic. If you see signs of a severe allergic reaction (hives, swelling of the face and throat, difficulty breathing, a fast heartbeat, dizziness, or weakness), call 9-1-1 and get the person to the nearest hospital. ?For other signs that concern you, call your health care provider. ?Adverse reactions should be reported to the Vaccine Adverse Event Reporting System (VAERS). Your health care provider will usually file this report, or you can do it yourself. Visit the VAERS website at www.vaers.LAgents.no or call 3861890394. VAERS is only for reporting reactions,  and VAERS staff members do not give medical advice. ?6. The National Vaccine Injury Compensation Program ?The National Vaccine Injury Compensation Program (VICP) is a federal program that was created to compensate people who may have been injured by certain vaccines. Claims regarding alleged injury or death due to vaccination have a time limit for filing, which may be as short as two years. Visit the VICP website at SpiritualWord.at or call 716-008-2751 to learn about the program and about filing a claim. ?7. How can I learn more? ?Ask your health care provider. ?Call your local or state health department. ?Visit the website of the Food and Drug Administration (FDA) for vaccine package inserts and additional information at FinderList.no. ?Contact the Centers for Disease Control and Prevention (CDC): ?Call  408-509-5786 (1-800-CDC-INFO) or ?Visit CDC's website at PicCapture.uy. ?Vaccine Information Statement HPV Vaccine (12/16/2019) ?This information is not intended to replace advice given to you by your healt

## 2021-08-13 ENCOUNTER — Ambulatory Visit: Payer: 59 | Admitting: Obstetrics and Gynecology

## 2021-08-13 ENCOUNTER — Encounter: Payer: Self-pay | Admitting: Obstetrics and Gynecology

## 2021-08-13 ENCOUNTER — Other Ambulatory Visit (HOSPITAL_COMMUNITY)
Admission: RE | Admit: 2021-08-13 | Discharge: 2021-08-13 | Disposition: A | Discharge status: Home / Self Care | Payer: 59 | Source: Ambulatory Visit | Attending: Obstetrics and Gynecology | Admitting: Obstetrics and Gynecology

## 2021-08-13 VITALS — BP 120/78 | Resp 14 | Ht 65.0 in | Wt 133.0 lb

## 2021-08-13 DIAGNOSIS — N763 Subacute and chronic vulvitis: Secondary | ICD-10-CM | POA: Insufficient documentation

## 2021-08-13 DIAGNOSIS — N9089 Other specified noninflammatory disorders of vulva and perineum: Secondary | ICD-10-CM | POA: Diagnosis not present

## 2021-08-13 NOTE — Patient Instructions (Signed)
Vulva Biopsy, Care After °This sheet gives you information about how to care for yourself after your procedure. Your doctor may also give you more specific instructions. If you have problems or questions, contact your doctor. °What can I expect after the procedure? °After the procedure, it is common to have: °Slight bleeding from the biopsy site. °Soreness at the biopsy site. °Follow these instructions at home: °Biopsy site care ° °Follow instructions from your doctor about how to take care of your biopsy site. Make sure you: °Clean the area using water and mild soap two times a day or as told by your doctor. Gently pat the area dry. °If you were prescribed an antibiotic ointment, apply it as told by your doctor. Do not stop using the antibiotic even if your condition gets better. °Take a warm water bath (sitz bath) as needed to help with pain. A sitz bath is taken while you are sitting down. The water should only come up to your hips and cover your butt. °Leave stitches (sutures), skin glue, or skin tape (adhesive) strips in place. They may need to stay in place for 2 weeks or longer. If tape strips get loose and curl up, you may trim the loose edges. Do not remove tape strips completely unless your doctor says it is okay. °Check your biopsy area every day for signs of infection. Check for: °More redness, swelling, or pain. °More fluid or blood. °Warmth. °Pus or a bad smell. °Do not rub the biopsy area after peeing (urinating). °Gently pat the area dry, or use a bottle filled with warm water (peri-bottle) to clean the area. °Gently wipe from front to back. °Lifestyle °Wear loose, cotton underwear. °Do not wear tight pants. °Do not use a tampon, douche, or put anything in your vagina for at least 1 week or until your doctor says it is okay. °Do not have sex for at least 1 week or until your doctor says it is okay. °Do not exercise until your doctor says it is okay. °Do not swim or use a hot tub until your doctor  says it is okay. You may shower or take a sitz bath. °General instructions °Take over-the-counter and prescription medicines only as told by your doctor. °Use a sanitary pad until the bleeding stops. °Keep all follow-up visits as told by your doctor. This is important. °Contact a doctor if: °You have more redness, swelling, or pain around your biopsy site. °You have more fluid or blood coming from your biopsy site. °Your biopsy site feels warm when you touch it. °Medicines do not help with your pain. °Get help right away if: °You have a lot of bleeding from the vulva. °You have pus or a bad smell coming from your biopsy site. °You have a fever. °You have pain in the lower belly (abdomen). °Summary °After the procedure, it is common to have slight bleeding and soreness at the biopsy site. °Follow all instructions as told by your doctor. Clean the area with water and mild soap. Do not rub. Pat the area dry. °Take sitz baths as needed. Leave any stitches in place. °Check your biopsy site for infection. Signs include more redness, swelling, pain, fluid, or blood, or feeling warm when you touch it. °Get help right away if you have a lot of bleeding, a fever, pus or a bad smell, or pain in your lower belly. °This information is not intended to replace advice given to you by your health care provider. Make sure you discuss any   questions you have with your health care provider. °Document Revised: 10/28/2017 Document Reviewed: 10/29/2017 °Elsevier Patient Education © 2022 [employer] Inc. ° °

## 2021-08-13 NOTE — Progress Notes (Signed)
GYNECOLOGY  VISIT ?  ?HPI: ?26 y.o.   Single  Caucasian  female   ?G0P0000 with Patient's last menstrual period was 08/02/2021.   ?here for   Vulvar biopsies.   ?She has skin tags and chronic vulvar irritation.  ? ?She has itching around the vaginal opening.  ?She had a negative wet prep on 07/31/21.  ?She was given an Rx for Triamcinolone ointment at that visit.  ? ?GYNECOLOGIC HISTORY: ?Patient's last menstrual period was 08/02/2021. ?Contraception:  n/a ?Menopausal hormone therapy:  n/a ?Last mammogram:  n/a ?Last pap smear:   06/11/2020 NEG ?       ?OB History   ? ? Gravida  ?0  ? Para  ?0  ? Term  ?0  ? Preterm  ?0  ? AB  ?0  ? Living  ?0  ?  ? ? SAB  ?0  ? IAB  ?0  ? Ectopic  ?0  ? Multiple  ?0  ? Live Births  ?0  ?   ?  ?  ?    ? ?There are no problems to display for this patient. ? ? ?Past Medical History:  ?Diagnosis Date  ? Morphea   ? dx in elementary school  ? Vitamin D deficiency 2021  ? ? ?History reviewed. No pertinent surgical history. ? ?Current Outpatient Medications  ?Medication Sig Dispense Refill  ? Multiple Vitamins-Minerals (MULTIVITAMIN WITH MINERALS) tablet Take 1 tablet by mouth daily.    ? triamcinolone (KENALOG) 0.025 % ointment Apply 1 application. topically 2 (two) times daily. Use for 1 - 2 weeks as needed. 30 g 1  ? Vitamin D, Ergocalciferol, (DRISDOL) 1.25 MG (50000 UNIT) CAPS capsule Take 50,000 Units by mouth once a week.    ? ?No current facility-administered medications for this visit.  ?  ? ?ALLERGIES: Patient has no known allergies. ? ?Family History  ?Problem Relation Age of Onset  ? Thyroid disease Mother   ? ? ?Social History  ? ?Socioeconomic History  ? Marital status: Single  ?  Spouse name: Not on file  ? Number of children: 0  ? Years of education: Not on file  ? Highest education level: Not on file  ?Occupational History  ? Occupation: Consulting civil engineer  ?Tobacco Use  ? Smoking status: Never  ? Smokeless tobacco: Never  ?Substance and Sexual Activity  ? Alcohol use: No  ? Drug use:  No  ? Sexual activity: Not Currently  ?  Birth control/protection: Abstinence  ?  Comment: never sexually active  ?Other Topics Concern  ? Not on file  ?Social History Narrative  ? Pt was born in [place]. Has lived in [place] since age 84.    ?    Marital status: single; not dating  ?    Children: none  ?    Lives: with parents, brother  ?    Employment:  Consulting civil engineer  ?    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    Tobacco: none  ?     Alcohol: none  ?    Drugs: none  ?    Seatbelt: 100%; no texting while driving  ?    Sexual activity: none; males only.  ?    Exercise: no structured exercise   ? ?Social Determinants of Health  ? ?Financial Resource Strain: Not on file  ?Food Insecurity: Not on file  ?Transportation Needs: Not on file  ?Physical Activity: Not on file  ?Stress:  Not on file  ?Social Connections: Not on file  ?Intimate Partner Violence: Not on file  ? ? ?Review of Systems  ?All other systems reviewed and are negative. ? ?PHYSICAL EXAMINATION:   ? ?BP 120/78 (BP Location: Right Arm, Patient Position: Sitting, Cuff Size: Normal)   Resp 14   Ht 5\' 5"  (1.651 m)   Wt 133 lb (60.3 kg)   LMP 08/02/2021   BMI 22.13 kg/m?     ? ?General appearance: alert, cooperative and appears stated age ? ?Pelvic: External genitalia:  right mons pubis with 4 mm pink brown raised nevus,  rght medial thigh with 2 mm skin tag.  Perineum with slightly ulcerated area of perineum.   ?            ?Vulvar biopsies. ?Consent done.  ?Sterile prep with Hibiclens.  ?Local 1% lidocaine to all three biopsy areas.  ?4 mm punch biopsy to right mons and perineal area.  ?Simple suture of 3/0 Vicryl placed at each biopsy location.  ?Scissors used to excise the skin tag, and silver nitrate applied.  ?All biopsies sent to pathology separately.  ?Minimal EBL.  ?No complications.      ? ?Chaperone was present for exam:  yes.  ? ?ASSESSMENT ? ?Chronic vulvitis.  ?Vulvar lesions.  Nevus and skin  tag. ? ?PLAN ? ?Fu biopsy results.  ?Post biopsy precautions given.  ?Ok to have suture removal in 2 weeks if areas are bothersome.  ?  ?An After Visit Summary was printed and given to the patient. ? ?  ? ? ?

## 2021-08-16 LAB — SURGICAL PATHOLOGY

## 2021-09-02 ENCOUNTER — Encounter: Payer: Self-pay | Admitting: Obstetrics and Gynecology

## 2021-09-20 ENCOUNTER — Encounter: Payer: Self-pay | Admitting: Obstetrics and Gynecology

## 2021-10-10 NOTE — Progress Notes (Unsigned)
GYNECOLOGY  VISIT   HPI: 26 y.o.   Single  Caucasian  female   G0P0000 with Patient's last menstrual period was 10/07/2021 (exact date).   here for follow up after vulvar biopsy.  Patient had 2 skin tags removed and had a perineal biopsy for chronic vulvitis.    Developed vulvar irritation from the suture of the right mons. The suture did fall out.  Report she still has a scab there.   Perineum is not itching as much.  Has Rx for Triamcinolone ointment.   GYNECOLOGIC HISTORY: Patient's last menstrual period was 10/07/2021 (exact date). Contraception:  Abstinence Menopausal hormone therapy:  n/a Last mammogram:  n/a Last pap smear:   06-11-20 Neg, 09-12-17 Neg        OB History     Gravida  0   Para  0   Term  0   Preterm  0   AB  0   Living  0      SAB  0   IAB  0   Ectopic  0   Multiple  0   Live Births  0              There are no problems to display for this patient.   Past Medical History:  Diagnosis Date   Morphea    dx in elementary school   Vitamin D deficiency 2021    History reviewed. No pertinent surgical history.  Current Outpatient Medications  Medication Sig Dispense Refill   tretinoin (RETIN-A) 0.025 % cream Apply topically.     triamcinolone (KENALOG) 0.025 % ointment Apply 1 application. topically 2 (two) times daily. Use for 1 - 2 weeks as needed. 30 g 1   Vitamin D, Ergocalciferol, (DRISDOL) 1.25 MG (50000 UNIT) CAPS capsule Take 50,000 Units by mouth once a week.     No current facility-administered medications for this visit.     ALLERGIES: Patient has no known allergies.  Family History  Problem Relation Age of Onset   Thyroid disease Mother     Social History   Socioeconomic History   Marital status: Single    Spouse name: Not on file   Number of children: 0   Years of education: Not on file   Highest education level: Not on file  Occupational History   Occupation: Student  Tobacco Use   Smoking status:  Never   Smokeless tobacco: Never  Substance and Sexual Activity   Alcohol use: No   Drug use: No   Sexual activity: Not Currently    Birth control/protection: Abstinence    Comment: never sexually active  Other Topics Concern   Not on file  Social History Narrative   Pt was born in [place]. Has lived in [place] since age 37.        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      Exercise: no structured exercise    Social Determinants of Health   Financial Resource Strain: Not on file  Food Insecurity:  Not on file  Transportation Needs: Not on file  Physical Activity: Not on file  Stress: Not on file  Social Connections: Not on file  Intimate Partner Violence: Not on file    Review of Systems  All other systems reviewed and are negative.  PHYSICAL EXAMINATION:    BP 110/70   Ht 5\' 5"  (1.651 m)   Wt 133 lb (60.3 kg)   LMP 10/07/2021 (Exact Date)   BMI 22.13 kg/m     General appearance: alert, cooperative and appears stated age   Pelvic: External genitalia:  right mons with scar noted.  No suture present.  No eschar.                  Chaperone was present for exam:  10/09/2021, CMA  ASSESSMENT  Hx chronic vulvitis.  Perineal biopsy showing acute and chronic dermatitis. Status post excision of vulvar nevi.  Skin healing well at all locations.   PLAN  Reassurance given.  Ok for triamcinolone ointment for vulvar irritation.  Fu prn.   An After Visit Summary was printed and given to the patient.  15 min  total time was spent for this patient encounter, including preparation, face-to-face counseling with the patient, coordination of care, and documentation of the  encounter.

## 2021-10-14 ENCOUNTER — Ambulatory Visit: Payer: 59 | Admitting: Obstetrics and Gynecology

## 2021-10-14 ENCOUNTER — Encounter: Payer: Self-pay | Admitting: Obstetrics and Gynecology

## 2021-10-14 VITALS — BP 110/70 | Ht 65.0 in | Wt 133.0 lb

## 2021-10-14 DIAGNOSIS — N9089 Other specified noninflammatory disorders of vulva and perineum: Secondary | ICD-10-CM

## 2021-10-14 DIAGNOSIS — N763 Subacute and chronic vulvitis: Secondary | ICD-10-CM

## 2022-07-21 NOTE — Progress Notes (Deleted)
27 y.o. G0P0000 Single Caucasian female here for annual exam.    PCP:     No LMP recorded.           Sexually active: No.  The current method of family planning is abstinence.    Exercising: {yes no:314532}  {types:19826} Smoker:  no  Health Maintenance: Pap:  06-11-20 Neg, 09-12-17 Neg  History of abnormal Pap:  no MMG:  n/a Colonoscopy:  n/a BMD:   n/a  Result  n/a TDaP:  05/12/13 Gardasil:   {YES [id number] HIV: Hep C: Screening Labs:  Hb today: ***, Urine today: ***   reports that she has never smoked. She has never used smokeless tobacco. She reports that she does not drink alcohol and does not use drugs.  Past Medical History:  Diagnosis Date   Morphea    dx in elementary school   Vitamin D deficiency 2021    No past surgical history on file.  Current Outpatient Medications  Medication Sig Dispense Refill   tretinoin (RETIN-A) 0.025 % cream Apply topically.     triamcinolone (KENALOG) 0.025 % ointment Apply 1 application. topically 2 (two) times daily. Use for 1 - 2 weeks as needed. 30 g 1   Vitamin D, Ergocalciferol, (DRISDOL) 1.25 MG (50000 UNIT) CAPS capsule Take 50,000 Units by mouth once a week.     No current facility-administered medications for this visit.    Family History  Problem Relation Age of Onset   Thyroid disease Mother     Review of Systems  Exam:   There were no vitals taken for this visit.    General appearance: alert, cooperative and appears stated age Head: normocephalic, without obvious abnormality, atraumatic Neck: no adenopathy, supple, symmetrical, trachea midline and thyroid normal to inspection and palpation Lungs: clear to auscultation bilaterally Breasts: normal appearance, no masses or tenderness, No nipple retraction or dimpling, No nipple discharge or bleeding, No axillary adenopathy Heart: regular rate and rhythm Abdomen: soft, non-tender; no masses, no organomegaly Extremities: extremities normal, atraumatic, no cyanosis  or edema Skin: skin color, texture, turgor normal. No rashes or lesions Lymph nodes: cervical, supraclavicular, and axillary nodes normal. Neurologic: grossly normal  Pelvic: External genitalia:  no lesions              No abnormal inguinal nodes palpated.              Urethra:  normal appearing urethra with no masses, tenderness or lesions              Bartholins and Skenes: normal                 Vagina: normal appearing vagina with normal color and discharge, no lesions              Cervix: no lesions              Pap taken: {yes no:314532} Bimanual Exam:  Uterus:  normal size, contour, position, consistency, mobility, non-tender              Adnexa: no mass, fullness, tenderness              Rectal exam: {yes no:314532}.  Confirms.              Anus:  normal sphincter tone, no lesions  Chaperone was present for exam:  ***  Assessment:   Well woman visit with gynecologic exam.   Plan: Mammogram screening discussed. Self breast awareness reviewed. Pap and HR HPV  as above. Guidelines for Calcium, Vitamin D, regular exercise program including cardiovascular and weight bearing exercise.   Follow up annually and prn.   Additional counseling given.  {yes [id number]. _______ minutes face to face time of which over 50% was spent in counseling.    After visit summary provided.

## 2022-08-04 ENCOUNTER — Ambulatory Visit: Payer: 59 | Admitting: Obstetrics and Gynecology

## 2022-09-24 ENCOUNTER — Ambulatory Visit: Payer: 59 | Admitting: Obstetrics and Gynecology

## 2023-02-14 ENCOUNTER — Encounter: Payer: Self-pay | Admitting: Physical Therapy

## 2023-02-14 ENCOUNTER — Ambulatory Visit: Payer: 59 | Attending: Dermatology | Admitting: Physical Therapy

## 2023-02-14 ENCOUNTER — Other Ambulatory Visit: Payer: Self-pay

## 2023-02-14 DIAGNOSIS — M25642 Stiffness of left hand, not elsewhere classified: Secondary | ICD-10-CM | POA: Diagnosis present

## 2023-02-14 DIAGNOSIS — M25641 Stiffness of right hand, not elsewhere classified: Secondary | ICD-10-CM | POA: Diagnosis present

## 2023-02-14 DIAGNOSIS — M6281 Muscle weakness (generalized): Secondary | ICD-10-CM

## 2023-02-14 NOTE — Therapy (Signed)
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 No pertinent surgical history. There are no problems to display for this patient.   PCP: Dwain Sarna  REFERRING PROVIDER: Gus Rankin,*  THERAPY DIAG:  Stiffness of right hand, not elsewhere classified  Stiffness of left hand, not elsewhere classified  Muscle weakness  REFERRING DIAG: Linear scleroderma   Rationale for Evaluation and Treatment:  Rehabilitation  SUBJECTIVE:  PERTINENT PAST HISTORY:  Linear scleroderma        PRECAUTIONS: None  WEIGHT BEARING RESTRICTIONS No  FALLS:  Has patient fallen in last 6 months? No, Number of falls: 0  MOI/History of condition:  Onset date: Chronic since childhood but recently worsening over the last 3-4 years  SUBJECTIVE STATEMENT  Jenny Wells is a 27 y.o. female who presents to clinic with chief complaint of hand stiffness R>L.  She has difficulty making a complete fist with either her R or L hand, but is significantly more limited with her R.  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  There are times when her R hand "locks up" when she is typing.  She is able to manually stretch but this is temporary.  This is happening a few times a week.  She is not currently doing any stretching or exercises for her hands.  She is starting on methotrexate.     Red flags:  denies   Pain:  Are you having pain? No  Occupation: computer  work  Education administrator: na  Higher education careers adviser: R  Patient Goals/Specific Activities: improve mobility of hands and wrists   OBJECTIVE:   DIAGNOSTIC FINDINGS:  NA  GENERAL OBSERVATION/GAIT:  NA  SENSATION:  Light touch: Appears intact  UPPER EXTREMITY MMT:  MMT Right (Eval) Left (Eval)  Shoulder flexion    Shoulder abduction (C5)    Shoulder ER    Shoulder IR    Ulnar deviation n n  Radial Deviation n n  Shoulder extension    Grip strength 42 lbs 40 lbs  Shoulder shrug (C4)    Elbow flexion (C6) n n  Elbow ext (C7) n n  Wrist flexion n n  Wrist ext n n  Grossly     (Blank rows = not tested, score listed is out of 5 possible points.  N = WNL, D = diminished, C = clear for gross weakness with myotome testing, * = concordant pain with testing)    UPPER EXTREMITY AROM:  ROM Right (Eval) Left (Eval)  Wrist flexion (norm ~75) 52 60  Wrist ext (norm ~70) 55 65  Ulnar deviation (norm ~35) 30 35  Radial deviation (norm ~20) 20 20  Functional IR    Functional ER    Shoulder extension    Elbow extension n n  Elbow flexion n  n   (Blank rows = not tested, N = WNL, * = concordant pain with testing)  Finger flexion MCP digits L 2: 50 3: 50 4:45 5:40; R 25: 50 3: 50 4:40 5:20  PIP Joints - significant limitation R>L limiting cylindrical grip    PALPATION:   Notable thickening of the skin on the palmar surface of bil hands R>L  PATIENT SURVEYS:  FOTO 87 ->87   TODAY'S TREATMENT  Creating, reviewing, and completing below HEP  PATIENT EDUCATION:  POC, diagnosis, prognosis, HEP, and outcome measures.  Pt educated via explanation, demonstration, and handout (HEP).  Pt confirms understanding verbally.   HOME EXERCISE PROGRAM: Access Code: [email or web address] URL: [email or web address] Date: 02/14/2023 Prepared by: Alphonzo Severance  Exercises - Wrist Prayer Stretch at Table  - 1 x daily - 7 x weekly - 1 sets - 3 reps - 45'' hold - Reverse Prayer Stretch   - 1 x daily - 7 x weekly - 1 sets - 3 reps - 45'' hold - Seated Wrist Flexion with Self-Anchored Resistance  - 1 x daily - 7 x weekly - 3 sets - 10 reps - Seated Wrist Flexion with Anchored Resistance  - 1 x daily - 7 x weekly - 3 sets - 10 reps - Putty Squeezes  - 1 x daily - 7 x weekly - 3 sets - 10 reps - Finger Extension with Putty  - 1 x daily - 7 x weekly - 3 sets - 10 reps - Seated Finger Extension with Putty  - 1 x daily - 7 x weekly - 3 sets - 10 reps - Seated Finger Composite Flexion with Putty  - 1 x daily - 7 x weekly - 3 sets - 10 reps  Treatment priorities   Eval        Finger flexion ROM        Wrist ext ROM        Add finger glides                          ASSESSMENT:  CLINICAL IMPRESSION: [name] is a 27 y.o. 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  She is unable to fully form a fist with her R hand.  This interferes with her work and she would like to prevent progression of the condition if possible.  She will benefit from skilled therapy to address ROM limitations.  I will also check to see if it is possible to get her in to see an OT as the problem is essentially confined to her hands.  OBJECTIVE IMPAIRMENTS: wrist and finger ROM  ACTIVITY LIMITATIONS: work, grasping, lifting  PERSONAL FACTORS: See medical history and pertinent history   REHAB POTENTIAL: Good  CLINICAL DECISION MAKING: Stable/uncomplicated  EVALUATION COMPLEXITY: Low   GOALS:   SHORT TERM GOALS: Target date: 03/14/2023   Sidnee will be >75% HEP compliant to improve carryover between sessions and facilitate independent management of condition  Evaluation: ongoing Goal status: INITIAL   LONG TERM GOALS: Target date: 04/11/2023   Amirra will improve FOTO score to 90 as a proxy for functional improvement  Evaluation/Baseline: 87 Goal status: INITIAL    2.  Shary will be able to form a fist with her R hand  to enable grasping and lifting tasks  Evaluation/Baseline: limited Goal status: INITIAL   3.  Camilia will demonstrate min restrictions with Phalen's and reverse  Phalen's demonstrating improved wrist flexion and ext ROM  Evaluation/Baseline: limited Goal status: INITIAL   4.  Latoi will report confidence in self management of condition at time of discharge with advanced HEP  Evaluation/Baseline: unable to self manage Goal status: INITIAL  5.  Linnell will be able to type at work without her familiar "locking up sensation"  Evaluation/Baseline: limited Goal status: INITIAL    PLAN: PT FREQUENCY: 1-2x/week  PT DURATION: 8 weeks  PLANNED INTERVENTIONS: Therapeutic exercises, Aquatic therapy, Therapeutic activity, Neuro Muscular re-education, Gait training, Patient/Family education, Joint mobilization, Dry Needling, Electrical stimulation, Spinal mobilization and/or manipulation, Moist heat, Taping, Vasopneumatic device, Ionotophoresis 4mg /ml Dexamethasone, and Manual therapy   Alphonzo Severance PT, DPT 02/14/2023, 12:22 PM

## 2023-03-04 ENCOUNTER — Encounter: Payer: Self-pay | Admitting: Physical Therapy

## 2023-03-04 ENCOUNTER — Ambulatory Visit: Payer: 59 | Admitting: Physical Therapy

## 2023-03-04 DIAGNOSIS — M25641 Stiffness of right hand, not elsewhere classified: Secondary | ICD-10-CM

## 2023-03-04 DIAGNOSIS — M6281 Muscle weakness (generalized): Secondary | ICD-10-CM

## 2023-03-04 DIAGNOSIS — M25642 Stiffness of left hand, not elsewhere classified: Secondary | ICD-10-CM

## 2023-03-04 NOTE — Therapy (Unsigned)
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 No pertinent surgical history. There are no problems to display for this patient.   PCP: Jenny Wells  REFERRING PROVIDER: Gus Wells,*  THERAPY DIAG:  Stiffness of right hand, not elsewhere classified  Stiffness of left hand, not elsewhere classified  Muscle weakness  REFERRING DIAG: Linear scleroderma   Rationale for Evaluation and Treatment:  Rehabilitation  SUBJECTIVE:  PERTINENT PAST HISTORY:  Linear scleroderma        PRECAUTIONS: None  WEIGHT BEARING RESTRICTIONS No  FALLS:  Has patient fallen in last 6 months? No, Number of falls: 0  MOI/History of condition:  Onset date: Chronic since childhood but recently worsening over the last 3-4 years  SUBJECTIVE STATEMENT  Pt reports that she has been completing her HEP at home.  She feels that she has made some progress with her grip and can now grip the theraband with less effort.   Red flags:  denies   Pain:  Are you having pain? No  Occupation: computer work  Education administrator: na  Higher education careers adviser: R  Patient Goals/Specific Activities: improve mobility of hands and wrists   OBJECTIVE:   DIAGNOSTIC FINDINGS:  NA  GENERAL OBSERVATION/GAIT:  NA  SENSATION:  Light touch: Appears intact  UPPER EXTREMITY MMT:  MMT Right (Eval) Left (Eval)  Shoulder flexion    Shoulder abduction (C5)    Shoulder ER    Shoulder IR    Ulnar deviation n n  Radial Deviation n n  Shoulder  extension    Grip strength 42 lbs 40 lbs  Shoulder shrug (C4)    Elbow flexion (C6) n n  Elbow ext (C7) n n  Wrist flexion n n  Wrist ext n n  Grossly     (Blank rows = not tested, score listed is out of 5 possible points.  N = WNL, D = diminished, C = clear for gross weakness with myotome testing, * = concordant pain with testing)    UPPER EXTREMITY AROM:  ROM Right (Eval) Left (Eval)  Wrist flexion (norm ~75) 52 60  Wrist ext (norm ~70) 55 65  Ulnar deviation (norm ~35) 30 35  Radial deviation (norm ~20) 20 20  Functional IR    Functional ER    Shoulder extension    Elbow extension n n  Elbow flexion n n   (Blank rows = not tested, N = WNL, * = concordant pain with testing)  Finger flexion MCP digits L 2: 50 3: 50 4:45 5:40; R 25: 50 3: 50 4:40 5:20  PIP Joints - significant limitation R>L limiting cylindrical grip    PALPATION:   Notable thickening of the skin on the palmar surface of bil hands R>L  PATIENT SURVEYS:  FOTO 87 ->87   TODAY'S TREATMENT   [facility] Adult PT Treatment:  Therapeutic Exercise:  4 min moist heat before ROM exercises Towel scrunches 3x Therabar wrist ext and flexion - yellow - 20x ea Ulnar and radial deviation - yellow therabar - 20x ea Segmental finger flexion with palm slide Key grip - each finger velcro slide - 1x ea Finger ext - 20x - yellow TB Towel grip rows - 13# - 2x10 with progressively smaller grip Ext 2x10 1 set with only digits 4 and 5 Wrist ext/flexion roll ups with dowel - 2#  HOME EXERCISE PROGRAM: Access Code: [email or web address] URL: [email or web address] Date: 02/14/2023 Prepared by: Jenny Wells  Exercises - Wrist Prayer Stretch at Table  - 1 x daily - 7 x weekly - 1 sets - 3 reps - 45'' hold - Reverse Prayer Stretch  - 1 x daily - 7 x weekly - 1 sets - 3 reps - 45'' hold - Seated Wrist Flexion with Self-Anchored Resistance  - 1 x daily - 7 x weekly - 3 sets - 10 reps - Seated Wrist Flexion with  Anchored Resistance  - 1 x daily - 7 x weekly - 3 sets - 10 reps - Putty Squeezes  - 1 x daily - 7 x weekly - 3 sets - 10 reps - Finger Extension with Putty  - 1 x daily - 7 x weekly - 3 sets - 10 reps - Seated Finger Extension with Putty  - 1 x daily - 7 x weekly - 3 sets - 10 reps - Seated Finger Composite Flexion with Putty  - 1 x daily - 7 x weekly - 3 sets - 10 reps  Treatment priorities   Eval        Finger flexion ROM        Wrist ext ROM        Add finger glides                          ASSESSMENT:  CLINICAL IMPRESSION: Jenny Wells tolerated session well with no adverse reaction.  Concentrated on improving finger ROM and grip strength.  Pt with significant fatigue with grip strength activities but no increase in pain.  Encouraged her to continue with HEP and stretching at home while waiting for OT referral.  OBJECTIVE IMPAIRMENTS: wrist and finger ROM  ACTIVITY LIMITATIONS: work, grasping, lifting  PERSONAL FACTORS: See medical history and pertinent history   REHAB POTENTIAL: Good  CLINICAL DECISION MAKING: Stable/uncomplicated  EVALUATION COMPLEXITY: Low   GOALS:   SHORT TERM GOALS: Target date: 03/14/2023   Jenny Wells will be >75% HEP compliant to improve carryover between sessions and facilitate independent management of condition  Evaluation: ongoing Goal status: INITIAL   LONG TERM GOALS: Target date: 04/11/2023   Jenny Wells will improve FOTO score to 90 as a proxy for functional improvement  Evaluation/Baseline: 87 Goal status: INITIAL    2.  Jenny Wells will be able to form a fist with her R hand to enable grasping and lifting tasks  Evaluation/Baseline: limited Goal status: INITIAL   3.  Jenny Wells will demonstrate min restrictions with Phalen's and reverse Phalen's demonstrating improved wrist flexion and ext ROM  Evaluation/Baseline: limited Goal status: INITIAL   4.  Jenny Wells will report confidence in self management of condition at time of discharge with  advanced HEP  Evaluation/Baseline: unable to self manage Goal status: INITIAL  5.  Tabbetha will be able to type at work without her familiar "locking up sensation"  Evaluation/Baseline: limited Goal status: INITIAL    PLAN: PT FREQUENCY: 1-2x/week  PT DURATION: 8 weeks  PLANNED INTERVENTIONS: Therapeutic exercises, Aquatic therapy, Therapeutic activity, Neuro Muscular re-education, Gait training, Patient/Family education, Joint mobilization, Dry Needling, Electrical stimulation, Spinal mobilization and/or manipulation, Moist heat, Taping, Vasopneumatic device, Ionotophoresis 4mg /ml Dexamethasone, and Manual therapy   Jenny Wells PT, DPT 03/05/2023, 9:05 AM

## 2023-03-10 ENCOUNTER — Ambulatory Visit: Payer: 59 | Admitting: Occupational Therapy

## 2023-03-10 ENCOUNTER — Encounter: Payer: Self-pay | Admitting: Occupational Therapy

## 2023-03-10 DIAGNOSIS — M25641 Stiffness of right hand, not elsewhere classified: Secondary | ICD-10-CM

## 2023-03-10 DIAGNOSIS — M6281 Muscle weakness (generalized): Secondary | ICD-10-CM

## 2023-03-10 DIAGNOSIS — M25642 Stiffness of left hand, not elsewhere classified: Secondary | ICD-10-CM

## 2023-03-10 NOTE — Therapy (Signed)
OUTPATIENT OCCUPATIONAL THERAPY ORTHO EVALUATION  Patient Name: Jenny Wells MRN: [id number] DOB:November 03, 1995, 27 y.o., female Today's Date: 03/10/2023  PCP: Rebecka Apley, NP  ([facility])  REFERRING PROVIDER: Gus Rankin, MD (From [facility])  END OF SESSION:  OT End of Session - 03/10/23 1619     Visit Number 1    Number of Visits 9    Authorization Type UHC    OT Start Time 1619    OT Stop Time 1710    OT Time Calculation (min) 51 min    Activity Tolerance Patient tolerated treatment well    Behavior During Therapy WFL for tasks assessed/performed             Past Medical History:  Diagnosis Date   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 No pertinent surgical history. There are no problems to display for this patient.   ONSET DATE: 03/06/2023; ~4 years ago, pt started to have symptoms  REFERRING DIAG: L94.1 (ICD-10-CM) - Linear scleroderma  THERAPY DIAG:  Stiffness of right hand, not elsewhere classified  Stiffness of left hand, not elsewhere classified  Muscle weakness  Rationale for Evaluation and Treatment: Rehabilitation  SUBJECTIVE:   SUBJECTIVE STATEMENT: States her cramping has lessened with completion of stretches but increase if she doesn't complete them as consistently. Locking up only occurs with activity and typically after lunch.   Pt accompanied by: self  PERTINENT HISTORY: PMH: morphea  PRECAUTIONS: None  WEIGHT BEARING RESTRICTIONS: No  PAIN:  Are you having pain? Yes: NPRS scale: at rest - 0/10 Pain location: R hand and L fingers Pain description: locks up Aggravating factors: "locking up" Relieving factors: stretching 6/10 with lock up episodes FALLS: Has patient fallen in last 6 months? No  PLOF: Independent; full time HR Coordinator; driving  PATIENT GOALS: Improve use of hands  OBJECTIVE:  Note: Objective measures were completed at Evaluation unless otherwise  noted.  HAND DOMINANCE: Right  ADLs: Overall ADLs: mod I  FUNCTIONAL OUTCOME MEASURES: Quick Dash: QuickDASH = 9.1 %  PSFS:   Total score = sum of the activity scores/number of activities Minimum detectable change (90%CI) for average score = 2 points Minimum detectable change (90%CI) for single activity score = 3 points   UPPER EXTREMITY ROM:     AROM Right (eval) Left (eval)  Shoulder flexion    Shoulder abduction    Elbow flexion    Elbow extension    Wrist flexion 61 64  Wrist extension 64 75  Wrist pronation    Wrist supination    Digit Composite Flexion 3.5 0.5 1 1.5 2 cm   Digit Composite Extension    Digit Opposition    (Blank rows = not tested)   UPPER EXTREMITY MMT:     MMT Right (eval) Left (eval)  Shoulder flexion 5/5 5/5  Shoulder abduction 5/5 5/5  Elbow flexion 5/5 5/5  Elbow extension 4+/5 5/5  (Blank rows = not tested)  HAND FUNCTION: Grip strength: Right: 44 lbs; Left: 49.1 lbs  COORDINATION: 9 Hole Peg test: Right: 28 sec; Left: 23 sec  SENSATION: Occasional paresthesias B (might be due to positioning of wrists)  EDEMA: none observed; mild reported (rings often feel tighter)  COGNITION: Overall cognitive status: Within functional limits for tasks assessed  OBSERVATIONS: Pt appears well-kept. Cording most notable in B palms with discoloration of hands dorsally. Unable to make fist on R. Pt ambulates without AD. No LOB,  no altered gait.   TODAY'S TREATMENT:                                                                                                                               N/A for this visit  PATIENT EDUCATION: Education details: OT Role and POC Person educated: Patient Education method: Explanation Education comprehension: verbalized understanding  HOME EXERCISE PROGRAM: N/A for this visit  GOALS:  SHORT TERM GOALS: Target date: 04/12/2023    Patient will demonstrate independence with initial LUE  HEP. Baseline: Goal status: INITIAL  2.  Pt will be independent with scar tissue massage including use of AD as needed.  Baseline:  Goal status: INITIAL  LONG TERM GOALS: Target date: 05/08/2023  Patient will demonstrate updated RUE and LUE HEP with 25% verbal cues or less for proper execution. Baseline:  Goal status: INITIAL  2.  Patient will report at least two-point increase in average PSFS score or at least three-point increase in a single activity score indicating functionally significant improvement given minimum detectable change.  Baseline: 3.7 total score (See above for individual activity scores) Goal status: INITIAL  ASSESSMENT:  CLINICAL IMPRESSION: Patient is a 27 y.o. female who was seen today for occupational therapy evaluation for linear scleroderma. Hx includes morphea. Patient currently presents below baseline level of functioning demonstrating functional deficits and impairments as noted below. Pt would benefit from skilled OT services in the outpatient setting to work on impairments as noted below to help pt return to PLOF as able.    PERFORMANCE DEFICITS: in functional skills including ADLs, IADLs, coordination, sensation, ROM, strength, fascial restrictions, decreased knowledge of precautions, decreased knowledge of use of DME, and UE functional use.   IMPAIRMENTS: are limiting patient from ADLs, IADLs, work, and leisure.   COMORBIDITIES: may have co-morbidities  that affects occupational performance. Patient will benefit from skilled OT to address above impairments and improve overall function.  MODIFICATION OR ASSISTANCE TO COMPLETE EVALUATION: Min-Moderate modification of tasks or assist with assess necessary to complete an evaluation.  OT OCCUPATIONAL PROFILE AND HISTORY: Problem focused assessment: Including review of records relating to presenting problem.  CLINICAL DECISION MAKING: LOW - limited treatment options, no task modification necessary  REHAB  POTENTIAL: Good  EVALUATION COMPLEXITY: Low    PLAN:  OT FREQUENCY: 1x/week  OT DURATION: 8 weeks  PLANNED INTERVENTIONS: 97168 OT Re-evaluation, 97535 self care/ADL training, 78295 therapeutic exercise, 97530 therapeutic activity, 97112 neuromuscular re-education, 97140 manual therapy, 97035 ultrasound, 97018 paraffin, 62130 fluidotherapy, 97010 moist heat, 97032 electrical stimulation (manual), 97760 Orthotics management and training, 86578 Splinting (initial encounter), [id number] Subsequent splinting/medication, scar mobilization, passive range of motion, patient/family education, and DME and/or AE instructions  RECOMMENDED OTHER SERVICES: none at this time  CONSULTED AND AGREED WITH PLAN OF CARE: Patient  PLAN FOR NEXT SESSION: scar tissue massage and ROM HEP   Delana Meyer, OT 03/10/2023, 5:12 PM

## 2023-03-14 ENCOUNTER — Encounter: Payer: 59 | Admitting: Physical Therapy

## 2023-03-19 ENCOUNTER — Ambulatory Visit: Payer: 59 | Admitting: Occupational Therapy

## 2023-03-24 ENCOUNTER — Ambulatory Visit: Payer: 59 | Attending: Dermatology | Admitting: Occupational Therapy

## 2023-03-24 DIAGNOSIS — M25641 Stiffness of right hand, not elsewhere classified: Secondary | ICD-10-CM | POA: Diagnosis present

## 2023-03-24 DIAGNOSIS — M25642 Stiffness of left hand, not elsewhere classified: Secondary | ICD-10-CM | POA: Insufficient documentation

## 2023-03-24 DIAGNOSIS — M6281 Muscle weakness (generalized): Secondary | ICD-10-CM | POA: Insufficient documentation

## 2023-03-24 NOTE — Therapy (Unsigned)
OUTPATIENT OCCUPATIONAL THERAPY ORTHO TREATMENT  Patient Name: Jenny Wells MRN: [id number] DOB:03-17-1996, 27 y.o., female Today's Date: 03/24/2023  PCP: Rebecka Apley, NP  ([facility])  REFERRING PROVIDER: Gus Rankin, MD (From [facility])  END OF SESSION:  OT End of Session - 03/24/23 1610     Visit Number 2    Number of Visits 9    Date for OT Re-Evaluation 05/08/23    Authorization Type [employer]    OT Start Time 1615    OT Stop Time 1655    OT Time Calculation (min) 40 min    Activity Tolerance Patient tolerated treatment well    Behavior During Therapy WFL for tasks assessed/performed              Past Medical History:  Diagnosis Date   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 There are no problems to display for this patient.   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 She inquires if she should continue exercises from PT.    Pt accompanied by: self  PERTINENT HISTORY: PMH: morphea  PRECAUTIONS: None  WEIGHT BEARING RESTRICTIONS: No  PAIN:  Are you having pain? Yes: NPRS scale: at rest - 0/10 Pain location: R hand and L fingers Pain description: locks up Aggravating factors: "locking up" Relieving factors: stretching 6/10 with lock up episodes  FALLS: Has patient fallen in last 6 months? No  PLOF: Independent; full time HR Coordinator; driving  PATIENT GOALS: Improve use of hands  OBJECTIVE:  Note: Objective measures were completed at Evaluation unless otherwise noted.  HAND DOMINANCE: Right  ADLs: Overall ADLs: mod  I  FUNCTIONAL OUTCOME MEASURES: Quick Dash: QuickDASH = 9.1 %  PSFS:   Total score = sum of the activity scores/number of activities Minimum detectable change (90%CI) for average score = 2 points Minimum detectable change (90%CI) for single activity score = 3 points   UPPER EXTREMITY ROM:     AROM Right (eval) Left (eval)  Shoulder flexion    Shoulder abduction    Elbow flexion    Elbow extension    Wrist flexion 61 64  Wrist extension 64 75  Wrist pronation    Wrist supination    Digit Composite Flexion 3.5 0.5 1 1.5 2 cm   Digit Composite Extension    Digit Opposition    (Blank rows = not tested)   UPPER EXTREMITY MMT:     MMT Right (eval) Left (eval)  Shoulder flexion 5/5 5/5  Shoulder abduction 5/5 5/5  Elbow flexion 5/5 5/5  Elbow extension 4+/5 5/5  (Blank rows = not tested)  HAND FUNCTION: Grip strength: Right: 44 lbs; Left: 49.1 lbs  COORDINATION: 9 Hole Peg test: Right: 28 sec; Left: 23 sec  SENSATION: Occasional paresthesias B (might be due to positioning of wrists)  EDEMA: none observed; mild reported (rings often feel tighter)  COGNITION: Overall cognitive status: Within functional limits for tasks assessed  OBSERVATIONS: Pt appears well-kept. Cording most notable in B palms with discoloration of hands dorsally. Unable to make fist on R. Pt ambulates without AD. No LOB,  no altered gait.   TODAY'S TREATMENT:                                                                                                                                OT educated pt to continue with exercises from PT in addition to finger composite flex stretch in efforts to make full fist on R and to reduce hand cramping.   OT educated pt on scar tissue massage as noted in pt instructions a needed to improve fascial restrictions from sclerosis as able. Pt additionally shown use of Dycem over these areas for further assistance.   Restoration of length tension relationship  to musculature in R palm and pinky to promote improved AROM and pain reduction of affected extremity using IASTM edge mobility tool and free up lotion as emollient.  PATIENT EDUCATION: Education details: scar massage Person educated: Patient Education method: Explanation, Demonstration, and Handouts Education comprehension: verbalized understanding, returned demonstration, and needs further education  HOME EXERCISE PROGRAM: 03/24/2023: Scar massage  GOALS:  SHORT TERM GOALS: Target date: 04/12/2023    Patient will demonstrate independence with initial LUE HEP. Baseline: Goal status: INITIAL  2.  Pt will be independent with scar tissue massage including use of AD as needed.  Baseline:  Goal status: INITIAL  LONG TERM GOALS: Target date: 05/08/2023  Patient will demonstrate updated RUE and LUE HEP with 25% verbal cues or less for proper execution. Baseline:  Goal status: INITIAL  2.  Patient will report at least two-point increase in average PSFS score or at least three-point increase in a single activity score indicating functionally significant improvement given minimum detectable change.  Baseline: 3.7 total score (See above for individual activity scores) Goal status: INITIAL  ASSESSMENT:  CLINICAL IMPRESSION: Patient demonstrates good tolerance with manual therapy and verbalizes good understanding of scar massage this visit as needed to progress towards goals. If able to break up or lengthen bands of sclerosis, pt will likely gain more ROM and therefore more functional use of UEs.   PERFORMANCE DEFICITS: in functional skills including ADLs, IADLs, coordination, sensation, ROM, strength, fascial restrictions, decreased knowledge of precautions, decreased knowledge of use of DME, and UE functional use.   IMPAIRMENTS: are limiting patient from ADLs, IADLs, work, and leisure.   COMORBIDITIES: may have co-morbidities  that affects occupational performance. Patient will  benefit from skilled OT to address above impairments and improve overall function.  REHAB POTENTIAL: Good  PLAN:  OT FREQUENCY: 1x/week  OT DURATION: 8 weeks  PLANNED INTERVENTIONS: 97168 OT Re-evaluation, 97535 self care/ADL training, 91478 therapeutic exercise, 97530 therapeutic activity, 97112 neuromuscular re-education, 97140 manual therapy, 97035 ultrasound, 97018 paraffin, 29562 fluidotherapy, 97010 moist heat, 97032 electrical stimulation (manual), 97760 Orthotics management and training, 13086 Splinting (initial encounter), [phone] Subsequent splinting/medication, scar mobilization, passive range of motion, patient/family education, and DME and/or AE instructions  RECOMMENDED OTHER SERVICES: none at this time  CONSULTED AND AGREED WITH PLAN OF CARE:  Patient  PLAN FOR NEXT SESSION: review scar tissue massage and ROM HEP; [place]   Delana Meyer, OT 03/24/2023, 5:08 PM

## 2023-03-31 ENCOUNTER — Encounter: Payer: 59 | Admitting: Occupational Therapy

## 2023-04-07 ENCOUNTER — Ambulatory Visit: Payer: 59 | Admitting: Occupational Therapy

## 2023-04-07 DIAGNOSIS — M25642 Stiffness of left hand, not elsewhere classified: Secondary | ICD-10-CM

## 2023-04-07 DIAGNOSIS — M25641 Stiffness of right hand, not elsewhere classified: Secondary | ICD-10-CM | POA: Diagnosis not present

## 2023-04-07 DIAGNOSIS — M6281 Muscle weakness (generalized): Secondary | ICD-10-CM

## 2023-04-07 NOTE — Therapy (Signed)
OUTPATIENT OCCUPATIONAL THERAPY ORTHO TREATMENT  Patient Name: Jenny Wells MRN: [id number] DOB:03-20-1996, 27 y.o., female Today's Date: 04/07/2023  PCP: Rebecka Apley, NP  ([facility])  REFERRING PROVIDER: Gus Rankin, MD (From [facility])  END OF SESSION:  OT End of Session - 04/07/23 1617     Visit Number 3    Number of Visits 9    Date for OT Re-Evaluation 05/08/23    Authorization Type UHC    OT Start Time 1617    OT Stop Time 1700    OT Time Calculation (min) 43 min    Activity Tolerance Patient tolerated treatment well    Behavior During Therapy WFL for tasks assessed/performed              Past Medical History:  Diagnosis Date   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 There are no problems to display for this patient.   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   Pt accompanied by: self  PERTINENT HISTORY: PMH: morphea  PRECAUTIONS: None  WEIGHT BEARING RESTRICTIONS: No  PAIN:  Are you having pain? Yes: NPRS scale: at rest - 0/10 Pain location: R hand and L fingers Pain description: locks up Aggravating factors: "locking up" Relieving factors: stretching 6/10 with lock up episodes  FALLS: Has patient fallen in last 6 months? No  PLOF: Independent; full time HR Coordinator; driving  PATIENT GOALS: Improve use of hands  OBJECTIVE:  Note: Objective measures were completed at Evaluation unless otherwise noted.  HAND DOMINANCE: Right  ADLs: Overall ADLs: mod  I  FUNCTIONAL OUTCOME MEASURES: Quick Dash: QuickDASH = 9.1 %  PSFS:   Total score = sum of the activity scores/number of activities Minimum detectable change (90%CI) for average score = 2 points Minimum detectable change (90%CI) for single activity score = 3 points   UPPER EXTREMITY ROM:     AROM Right (eval) Left (eval)  Shoulder flexion    Shoulder abduction    Elbow flexion    Elbow extension    Wrist flexion 61 64  Wrist extension 64 75  Wrist pronation    Wrist supination    Digit Composite Flexion 3.5 0.5 1 1.5 2 cm   Digit Composite Extension    Digit Opposition    (Blank rows = not tested)   UPPER EXTREMITY MMT:     MMT Right (eval) Left (eval)  Shoulder flexion 5/5 5/5  Shoulder abduction 5/5 5/5  Elbow flexion 5/5 5/5  Elbow extension 4+/5 5/5  (Blank rows = not tested)  HAND FUNCTION: Grip strength: Right: 44 lbs; Left: 49.1 lbs  COORDINATION: 9 Hole Peg test: Right: 28 sec; Left: 23 sec  SENSATION: Occasional paresthesias B (might be due to positioning of wrists)  EDEMA: none observed; mild reported (rings often feel tighter)  COGNITION: Overall cognitive status: Within functional limits for tasks assessed  OBSERVATIONS: Pt appears well-kept. Cording most notable in B palms with discoloration of hands dorsally. Unable to make fist on R. Pt ambulates without AD. No LOB, no altered gait.  TODAY'S TREATMENT:                                                                                                                              OT reviewed scar tissue massage with pt including various tools and modalities. Pt independent with scar tissue massage technique.   OT educated pt on use of cupping to R hand. Performed dynamic cupping at sites of linear scleroderma bands and surrounding areas to decrease adhesions by mobilizing the soft- tissues such as skin, fascia, neural tissues, muscles, ligaments and tendons and to promote local circulation  necessary for optimal functional movement by lifting and separating the tissue underneath the cup. Improved appearance to these areas upon completion with less palpable adhesions. Petechia present.   - Ultrasound completed for duration as noted below including:  Ultrasound applied to palmar and dorsal right hand and digits for 8 minutes, frequency of 3 MHz, 20% duty cycle, and 1.1 W/cm with pt's arm placed on soft towel for promotion of ROM, band reduction, and pain reduction in affected extremity.  PATIENT EDUCATION: Education details: cupping; [place] Person educated: Patient Education method: Medical illustrator Education comprehension: verbalized understanding, returned demonstration, and needs further education  HOME EXERCISE PROGRAM: 03/24/2023: Scar massage  GOALS:  SHORT TERM GOALS: Target date: 04/12/2023    Patient will demonstrate independence with initial LUE HEP. Baseline: Goal status: IN PROGRESS  2.  Pt will be independent with scar tissue massage including use of AD as needed.  Baseline:  Goal status: IN PROGRESS  LONG TERM GOALS: Target date: 05/08/2023  Patient will demonstrate updated RUE and LUE HEP with 25% verbal cues or less for proper execution. Baseline:  Goal status: INITIAL  2.  Patient will report at least two-point increase in average PSFS score or at least three-point increase in a single activity score indicating functionally significant improvement given minimum detectable change.  Baseline: 3.7 total score (See above for individual activity scores) Goal status: INITIAL  ASSESSMENT:  CLINICAL IMPRESSION: Patient demonstrates good tolerance with manual therapy and verbalizes good understanding of scar massage this visit as needed to progress towards goals. If able to break up or lengthen bands of sclerosis, pt will likely gain more ROM and therefore more functional use of UEs. This will likely require multi-modal approach.   PERFORMANCE  DEFICITS: in functional skills including ADLs, IADLs, coordination, sensation, ROM, strength, fascial restrictions, decreased knowledge of precautions, decreased knowledge of use of DME, and UE functional use.   IMPAIRMENTS: are limiting patient from ADLs, IADLs, work, and leisure.   COMORBIDITIES: may have co-morbidities  that affects occupational performance. Patient will benefit from skilled OT to address above impairments and improve overall function.  REHAB POTENTIAL: Good  PLAN:  OT FREQUENCY: 1x/week  OT DURATION: 8 weeks  PLANNED INTERVENTIONS: 97168 OT Re-evaluation, 97535 self care/ADL training, 16109 therapeutic exercise, 97530 therapeutic activity, 97112 neuromuscular re-education, 97140 manual therapy, 97035 ultrasound, 97018 paraffin, 60454 fluidotherapy, 97010  moist heat, [id number] electrical stimulation (manual), 40981 Orthotics management and training, [phone] Splinting (initial encounter), [phone] Subsequent splinting/medication, scar mobilization, passive range of motion, patient/family education, and DME and/or AE instructions  RECOMMENDED OTHER SERVICES: none at this time  CONSULTED AND AGREED WITH PLAN OF CARE: Patient  PLAN FOR NEXT SESSION: ROM HEP; [place]?; did she bring gua sha tool?   Delana Meyer, OT 04/07/2023, 5:05 PM

## 2023-04-14 ENCOUNTER — Ambulatory Visit: Payer: 59 | Attending: Dermatology | Admitting: Occupational Therapy

## 2023-04-14 DIAGNOSIS — M25642 Stiffness of left hand, not elsewhere classified: Secondary | ICD-10-CM | POA: Diagnosis present

## 2023-04-14 DIAGNOSIS — M6281 Muscle weakness (generalized): Secondary | ICD-10-CM | POA: Insufficient documentation

## 2023-04-14 DIAGNOSIS — M25641 Stiffness of right hand, not elsewhere classified: Secondary | ICD-10-CM | POA: Insufficient documentation

## 2023-04-14 NOTE — Patient Instructions (Addendum)

## 2023-04-14 NOTE — Therapy (Unsigned)
OUTPATIENT OCCUPATIONAL THERAPY ORTHO TREATMENT  Patient Name: Jenny Wells MRN: [id number] DOB:[birth year], 27 y.o., female Today's Date: 04/14/2023  PCP: Rebecka Apley, NP  ([facility])  REFERRING PROVIDER: Gus Rankin, MD (From [facility])  END OF SESSION:  OT End of Session - 04/14/23 1616     Visit Number 4    Number of Visits 9    Date for OT Re-Evaluation 05/08/23    Authorization Type UHC    OT Start Time 1616    OT Stop Time 1700    OT Time Calculation (min) 44 min    Activity Tolerance Patient tolerated treatment well    Behavior During Therapy WFL for tasks assessed/performed             Past Medical History:  Diagnosis Date   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 There are no problems to display for this patient.   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 No significant response to [place] or cupping.    Pt accompanied by: self  PERTINENT HISTORY: PMH: morphea  PRECAUTIONS: None  WEIGHT BEARING RESTRICTIONS: No  PAIN:  Are you having pain? Yes: NPRS scale: at rest - 0/10 Pain location: R hand and L fingers Pain description: locks up Aggravating factors: "locking up" Relieving factors: stretching 6/10 with lock up episodes  FALLS: Has patient fallen in last 6 months? No  PLOF: Independent; full time HR Coordinator; driving  PATIENT GOALS: Improve use of hands  OBJECTIVE:  Note: Objective measures were completed at Evaluation unless otherwise noted.  HAND DOMINANCE: Right  ADLs: Overall ADLs: mod I  FUNCTIONAL OUTCOME  MEASURES: Quick Dash: QuickDASH = 9.1 %  PSFS:   Total score = sum of the activity scores/number of activities Minimum detectable change (90%CI) for average score = 2 points Minimum detectable change (90%CI) for single activity score = 3 points   UPPER EXTREMITY ROM:     AROM Right (eval) Left (eval)  Shoulder flexion    Shoulder abduction    Elbow flexion    Elbow extension    Wrist flexion 61 64  Wrist extension 64 75  Wrist pronation    Wrist supination    Digit Composite Flexion 3.5 0.5 1 1.5 2 cm   Digit Composite Extension    Digit Opposition    (Blank rows = not tested)   UPPER EXTREMITY MMT:     MMT Right (eval) Left (eval)  Shoulder flexion 5/5 5/5  Shoulder abduction 5/5 5/5  Elbow flexion 5/5 5/5  Elbow extension 4+/5 5/5  (Blank rows = not tested)  HAND FUNCTION: Grip strength: Right: 44 lbs; Left: 49.1 lbs  COORDINATION: 9 Hole Peg test: Right: 28 sec; Left: 23 sec  SENSATION: Occasional paresthesias B (might be due to positioning of wrists)  EDEMA: none observed; mild reported (rings often feel tighter)  COGNITION: Overall cognitive status: Within functional limits for tasks assessed  OBSERVATIONS: Pt appears well-kept. Cording most notable in B palms with discoloration of hands dorsally. Unable to make fist on R. Pt ambulates without AD. No LOB, no altered gait.  TODAY'S TREATMENT:                                                                                                                              Pt placed hands in paraffin wax for thermal benefit as OT Educated pt on use of heat at home as noted in pt instructions.    OT initiated BUE ROM HEP as noted in pt instructions as needed to maximize ROM and prevent contractures.   PATIENT EDUCATION: Education details: tendon glides; finger flexion; heat  Person educated: Patient Education method: Medical illustrator Education comprehension: verbalized understanding,  returned demonstration, and needs further education  HOME EXERCISE PROGRAM: 03/24/2023: Scar massage 04/14/2023: tendon glides; finger flexion  Access Code: [email or web address]  Pt already has Access Code: [email or web address] URL: [email or web address]  GOALS:  SHORT TERM GOALS: Target date: 04/12/2023    Patient will demonstrate independence with initial LUE HEP. Baseline: Goal status: IN PROGRESS  2.  Pt will be independent with scar tissue massage including use of AD as needed.  Baseline:  Goal status: IN PROGRESS  LONG TERM GOALS: Target date: 05/08/2023  Patient will demonstrate updated RUE and LUE HEP with 25% verbal cues or less for proper execution. Baseline:  Goal status: INITIAL  2.  Patient will report at least two-point increase in average PSFS score or at least three-point increase in a single activity score indicating functionally significant improvement given minimum detectable change.  Baseline: 3.7 total score (See above for individual activity scores) Goal status: INITIAL  ASSESSMENT:  CLINICAL IMPRESSION: Pt verbalizes good understanding of conservative management with respect to the sclerodermic bands in her hands. Unfortunately, she is limited by carryover, which will likely impact her progression towards goals.  PERFORMANCE DEFICITS: in functional skills including ADLs, IADLs, coordination, sensation, ROM, strength, fascial restrictions, decreased knowledge of precautions, decreased knowledge of use of DME, and UE functional use.   IMPAIRMENTS: are limiting patient from ADLs, IADLs, work, and leisure.   COMORBIDITIES: may have co-morbidities  that affects occupational performance. Patient will benefit from skilled OT to address above impairments and improve overall function.  REHAB POTENTIAL: Good  PLAN:  OT FREQUENCY: 1x/week  OT DURATION: 8 weeks  PLANNED INTERVENTIONS: 97168 OT Re-evaluation, 97535 self care/ADL training, 09811 therapeutic exercise,  97530 therapeutic activity, 97112 neuromuscular re-education, 97140 manual therapy, 97035 ultrasound, 97018 paraffin, 91478 fluidotherapy, 97010 moist heat, 97032 electrical stimulation (manual), 97760 Orthotics management and training, 29562 Splinting (initial encounter), [id number] Subsequent splinting/medication, scar mobilization, passive range of motion, patient/family education, and DME and/or AE instructions  RECOMMENDED OTHER SERVICES: none at this time  CONSULTED AND AGREED WITH PLAN OF CARE: Patient  PLAN FOR NEXT SESSION: review HEPs; [place]?; did she bring gua sha tool?; FLUIDOTHERAPY   Delana Meyer, OT 04/14/2023, 5:10 PM

## 2023-04-21 ENCOUNTER — Ambulatory Visit: Payer: 59 | Admitting: Occupational Therapy

## 2023-04-21 DIAGNOSIS — M6281 Muscle weakness (generalized): Secondary | ICD-10-CM

## 2023-04-21 DIAGNOSIS — M25641 Stiffness of right hand, not elsewhere classified: Secondary | ICD-10-CM

## 2023-04-21 DIAGNOSIS — M25642 Stiffness of left hand, not elsewhere classified: Secondary | ICD-10-CM

## 2023-04-21 NOTE — Therapy (Signed)
OUTPATIENT OCCUPATIONAL THERAPY ORTHO TREATMENT  Patient Name: Jenny Wells MRN: [id number] DOB:Apr 19, 1996, 27 y.o., female Today's Date: 04/21/2023  PCP: Rebecka Apley, NP  ([facility])  REFERRING PROVIDER: Gus Rankin, MD (From [facility])  END OF SESSION:  OT End of Session - 04/21/23 1620     Visit Number 5    Number of Visits 9    Date for OT Re-Evaluation 05/08/23    Authorization Type UHC    OT Start Time 1619    OT Stop Time 1703    OT Time Calculation (min) 44 min    Activity Tolerance Patient tolerated treatment well    Behavior During Therapy WFL for tasks assessed/performed             Past Medical History:  Diagnosis Date   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 There are no problems to display for this patient.   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 She does feel the heat is beneficial. She reports she can actually feel her 2nd and 3rd digits touching the back of her palm in the fluidotherapy machine. This is something she reports she has not been able to do for awhile.    Pt accompanied by: self  PERTINENT HISTORY: PMH: morphea  PRECAUTIONS: None  WEIGHT BEARING RESTRICTIONS: No  PAIN:  Are you having pain? Yes: NPRS scale: at rest - 0/10 Pain location: R hand and L fingers Pain description: locks up Aggravating factors: "locking up" Relieving factors: stretching 6/10 with lock up episodes  FALLS: Has patient fallen in last 6 months? No  PLOF: Independent; full time HR Coordinator; driving  PATIENT GOALS:  Improve use of hands  OBJECTIVE:  Note: Objective measures were completed at Evaluation unless otherwise noted.  HAND DOMINANCE: Right  ADLs: Overall ADLs: mod I  FUNCTIONAL OUTCOME MEASURES: Quick Dash: QuickDASH = 9.1 %  PSFS:   Total score = sum of the activity scores/number of activities Minimum detectable change (90%CI) for average score = 2 points Minimum detectable change (90%CI) for single activity score = 3 points   UPPER EXTREMITY ROM:     AROM Right (eval) Left (eval)  Shoulder flexion    Shoulder abduction    Elbow flexion    Elbow extension    Wrist flexion 61 64  Wrist extension 64 75  Wrist pronation    Wrist supination    Digit Composite Flexion 3.5 0.5 1 1.5 2 cm   Digit Composite Extension    Digit Opposition    (Blank rows = not tested)   UPPER EXTREMITY MMT:     MMT Right (eval) Left (eval)  Shoulder flexion 5/5 5/5  Shoulder abduction 5/5 5/5  Elbow flexion 5/5 5/5  Elbow extension 4+/5 5/5  (Blank rows = not tested)  HAND FUNCTION: Grip strength: Right: 44 lbs; Left: 49.1 lbs  COORDINATION: 9 Hole Peg test: Right: 28 sec; Left: 23 sec  SENSATION: Occasional paresthesias B (might be due to positioning of wrists)  EDEMA: none observed; mild reported (rings often feel tighter)  COGNITION: Overall cognitive status: Within functional limits  for tasks assessed  OBSERVATIONS: Pt appears well-kept. Cording most notable in B palms with discoloration of hands dorsally. Unable to make fist on R. Pt ambulates without AD. No LOB, no altered gait.   TODAY'S TREATMENT:                                                                                                                               OT reviewed BUE ROM HEP as noted in pt instructions as needed to maximize ROM and prevent contractures. Pt required min cueing for proper execution.    Pt placed BUE in Fluidotherapy machine with supervised ROM x 10 min. Pt was educated to complete  RUE PROM during modality time to improve ROM and decrease pain/stiffness of affected extremity by use of the machine's massaging action and thermal properties.   Discussed use of Mindi Slicker tool for IASTM. Pt able to demonstrate proper use.   OT educated pt on pick up and storing of items in R hand to promote full composite flexion. Also educated on use of [email or web address] for home practice per pt goals.   PATIENT EDUCATION: Education details: tendon glides; finger flexion; fluido; Gua sha/IASTM  Person educated: Patient Education method: Medical illustrator Education comprehension: verbalized understanding, returned demonstration, and needs further education  HOME EXERCISE PROGRAM: 03/24/2023: Scar massage 04/14/2023: tendon glides; finger flexion  Access Code: [email or web address]  Pt already has Access Code: [email or web address] URL: [email or web address]  GOALS:  SHORT TERM GOALS: Target date: 04/12/2023    Patient will demonstrate independence with initial LUE HEP. Baseline: Goal status: MET  2.  Pt will be independent with scar tissue massage including use of AD as needed.  Baseline:  Goal status: MET  LONG TERM GOALS: Target date: 05/08/2023  Patient will demonstrate updated RUE and LUE HEP with 25% verbal cues or less for proper execution. Baseline:  Goal status: IN PROGRESS  2.  Patient will report at least two-point increase in average PSFS score or at least three-point increase in a single activity score indicating functionally significant improvement given minimum detectable change.  Baseline: 3.7 total score (See above for individual activity scores) Goal status: INITIAL  ASSESSMENT:  CLINICAL IMPRESSION: Pt demonstrates good understanding of HEP. Progression towards goals seems appropriate though pt still unable to form full fist on R. Believe this will be a gradual process given tightened extensor tendons.  PERFORMANCE DEFICITS: in functional skills including ADLs,  IADLs, coordination, sensation, ROM, strength, fascial restrictions, decreased knowledge of precautions, decreased knowledge of use of DME, and UE functional use.   IMPAIRMENTS: are limiting patient from ADLs, IADLs, work, and leisure.   COMORBIDITIES: may have co-morbidities  that affects occupational performance. Patient will benefit from skilled OT to address above impairments and improve overall function.  REHAB POTENTIAL: Good  PLAN:  OT FREQUENCY: 1x/week  OT DURATION: 8 weeks  PLANNED INTERVENTIONS: 97168 OT Re-evaluation, 97535 self care/ADL training, 62952 therapeutic exercise, 97530 therapeutic activity, 97112 neuromuscular  re-education, 97140 manual therapy, 97035 ultrasound, 16109 paraffin, 97039 fluidotherapy, 97010 moist heat, 97032 electrical stimulation (manual), 97760 Orthotics management and training, 97760 Splinting (initial encounter), [phone] Subsequent splinting/medication, scar mobilization, passive range of motion, patient/family education, and DME and/or AE instructions  RECOMMENDED OTHER SERVICES: none at this time  CONSULTED AND AGREED WITH PLAN OF CARE: Patient  PLAN FOR NEXT SESSION: review HEPs as needed; Anne Fu, OT 04/21/2023, 5:14 PM

## 2023-04-28 ENCOUNTER — Ambulatory Visit: Payer: 59 | Admitting: Occupational Therapy

## 2023-04-28 DIAGNOSIS — M6281 Muscle weakness (generalized): Secondary | ICD-10-CM

## 2023-04-28 DIAGNOSIS — M25641 Stiffness of right hand, not elsewhere classified: Secondary | ICD-10-CM | POA: Diagnosis not present

## 2023-04-28 DIAGNOSIS — M25642 Stiffness of left hand, not elsewhere classified: Secondary | ICD-10-CM

## 2023-04-28 NOTE — Therapy (Signed)
OUTPATIENT OCCUPATIONAL THERAPY ORTHO TREATMENT  Patient Name: Jenny Wells MRN: [id number] DOB:Oct 19, 1995, 27 y.o., female Today's Date: 04/28/2023  PCP: Rebecka Apley, NP  ([facility])  REFERRING PROVIDER: Gus Rankin, MD (From [facility])  END OF SESSION:  OT End of Session - 04/28/23 1624     Visit Number 6    Number of Visits 9    Date for OT Re-Evaluation 05/08/23    Authorization Type UHC    OT Start Time 1618    OT Stop Time 1656    OT Time Calculation (min) 38 min    Activity Tolerance Patient tolerated treatment well    Behavior During Therapy WFL for tasks assessed/performed             Past Medical History:  Diagnosis Date   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 There are no active problems to display for this patient.   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 Yes: NPRS scale: at rest - 0/10 Pain location: R hand and L fingers Pain description: locks up Aggravating factors: "locking up" Relieving factors: stretching 6/10 with lock up episodes  FALLS: Has patient fallen in last 6 months? No  PLOF: Independent; full time HR Coordinator; driving  PATIENT GOALS: Improve use of hands  OBJECTIVE:  Note: Objective measures were completed at Evaluation unless otherwise noted.  HAND DOMINANCE: Right  ADLs: Overall ADLs: mod I  FUNCTIONAL OUTCOME MEASURES: Quick Dash: QuickDASH = 9.1  %  PSFS:   Total score = sum of the activity scores/number of activities Minimum detectable change (90%CI) for average score = 2 points Minimum detectable change (90%CI) for single activity score = 3 points   UPPER EXTREMITY ROM:     AROM Right (eval) Left (eval)  Shoulder flexion    Shoulder abduction    Elbow flexion    Elbow extension    Wrist flexion 61 64  Wrist extension 64 75  Wrist pronation    Wrist supination    Digit Composite Flexion 3.5 0.5 1 1.5 2 cm   Digit Composite Extension    Digit Opposition    (Blank rows = not tested)   UPPER EXTREMITY MMT:     MMT Right (eval) Left (eval)  Shoulder flexion 5/5 5/5  Shoulder abduction 5/5 5/5  Elbow flexion 5/5 5/5  Elbow extension 4+/5 5/5  (Blank rows = not tested)  HAND FUNCTION: Grip strength: Right: 44 lbs; Left: 49.1 lbs  COORDINATION: 9 Hole Peg test: Right: 28 sec; Left: 23 sec  SENSATION: Occasional paresthesias B (might be due to positioning of wrists)  EDEMA: none observed; mild reported (rings often feel tighter)  COGNITION: Overall cognitive status: Within functional limits for tasks assessed  OBSERVATIONS: Pt appears well-kept. Cording most notable in B palms with discoloration of hands dorsally. Unable to make fist on R. Pt ambulates without AD. No LOB, no altered gait.   TODAY'S TREATMENT:  Pt placed BUE in Fluidotherapy machine with supervised ROM x 10 min. Pt was educated to complete RUE PROM during modality time to improve ROM and decrease pain/stiffness of affected extremity by use of the machine's massaging action and thermal properties.   OT initiated RUE and LUE red theraputty exercises as noted in patient instructions for stretching of bands, coordination, and strength  OT educated pt on prolonged stretch of R digits 4 and 5 with use of 1" Coban. While  taped, she completed pick up and stacking of small items using three jaw chuck pinch.   PATIENT EDUCATION: Education details: putty; prolonged stretching Person educated: Patient Education method: Explanation, Demonstration, and Handouts Education comprehension: verbalized understanding, returned demonstration, and needs further education  HOME EXERCISE PROGRAM: 03/24/2023: Scar massage 04/14/2023: tendon glides; finger flexion  Access Code: [email or web address]  Pt already has Access Code: [email or web address] URL: [email or web address] 04/28/2023: putty Access Code: [email or web address] URL: [email or web address]  GOALS:  SHORT TERM GOALS: MET 2/2  LONG TERM GOALS: Target date: 05/08/2023  Patient will demonstrate updated RUE and LUE HEP with 25% verbal cues or less for proper execution. Baseline:  Goal status: IN PROGRESS  2.  Patient will report at least two-point increase in average PSFS score or at least three-point increase in a single activity score indicating functionally significant improvement given minimum detectable change.  Baseline: 3.7 total score (See above for individual activity scores) Goal status: INITIAL  ASSESSMENT:  CLINICAL IMPRESSION: Pt demonstrates improved ability to make near total composite flexion with R digits. Progressing towards goals as expected. Likely will be ready for d/c at next visit.  PERFORMANCE DEFICITS: in functional skills including ADLs, IADLs, coordination, sensation, ROM, strength, fascial restrictions, decreased knowledge of precautions, decreased knowledge of use of DME, and UE functional use.   IMPAIRMENTS: are limiting patient from ADLs, IADLs, work, and leisure.   COMORBIDITIES: may have co-morbidities  that affects occupational performance. Patient will benefit from skilled OT to address above impairments and improve overall function.  REHAB POTENTIAL: Good  PLAN:  OT FREQUENCY: 1x/week  OT DURATION: 8 weeks  PLANNED  INTERVENTIONS: 97168 OT Re-evaluation, 97535 self care/ADL training, 16109 therapeutic exercise, 97530 therapeutic activity, 97112 neuromuscular re-education, 97140 manual therapy, 97035 ultrasound, 97018 paraffin, 60454 fluidotherapy, 97010 moist heat, 97032 electrical stimulation (manual), 97760 Orthotics management and training, 09811 Splinting (initial encounter), [phone] Subsequent splinting/medication, scar mobilization, passive range of motion, patient/family education, and DME and/or AE instructions  RECOMMENDED OTHER SERVICES: none at this time  CONSULTED AND AGREED WITH PLAN OF CARE: Patient  PLAN FOR NEXT SESSION: review putty HEP; d/c  Delana Meyer, OT 04/28/2023, 5:17 PM

## 2023-05-07 ENCOUNTER — Ambulatory Visit: Payer: 59 | Admitting: Occupational Therapy

## 2023-05-07 DIAGNOSIS — M25642 Stiffness of left hand, not elsewhere classified: Secondary | ICD-10-CM

## 2023-05-07 DIAGNOSIS — M25641 Stiffness of right hand, not elsewhere classified: Secondary | ICD-10-CM | POA: Diagnosis not present

## 2023-05-07 DIAGNOSIS — M6281 Muscle weakness (generalized): Secondary | ICD-10-CM

## 2023-05-07 NOTE — Therapy (Signed)
OUTPATIENT OCCUPATIONAL THERAPY ORTHO TREATMENT AND DISCHARGE  Patient Name: Jenny Wells MRN: [id number] DOB:[birth year], 27 y.o., female Today's Date: 05/07/2023  OCCUPATIONAL THERAPY DISCHARGE SUMMARY  Visits from Start of Care: 7  Current functional level related to goals / functional outcomes: Patient has met all short and long-term goals to date.   Remaining deficits: Sclerotic banding of R hand preventing full composite flexion.    Education / Equipment: Continue with HEP and strategies   Patient agrees to discharge. Patient goals were met. 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 There are no active problems to display for this patient.   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   Pt accompanied by: self  PERTINENT HISTORY: PMH: morphea  PRECAUTIONS: None  WEIGHT BEARING RESTRICTIONS: No  PAIN:  Are you having pain? Yes: NPRS  scale: at rest - 0/10 Pain location: R hand and L fingers Pain description: locks up Aggravating factors: "locking up" Relieving factors: stretching 6/10 with lock up episodes  FALLS: Has patient fallen in last 6 months? No  PLOF: Independent; full time HR Coordinator; driving  PATIENT GOALS: Improve use of hands  OBJECTIVE:  Note: Objective measures were completed at Evaluation unless otherwise noted.  HAND DOMINANCE: Right  ADLs: Overall ADLs: mod I  FUNCTIONAL OUTCOME MEASURES: Quick Dash: QuickDASH = 9.1 %  PSFS:  05/07/2023: 7.0 total score (3.7 total score at evaluation)   Total score = sum of the activity scores/number of activities Minimum detectable change (90%CI) for average score = 2 points Minimum detectable change (90%CI) for single activity score = 3 points   UPPER EXTREMITY ROM:     AROM Right (eval) Left (eval) Right (12/26)   Shoulder flexion     Shoulder abduction     Elbow flexion     Elbow extension     Wrist flexion 61 64   Wrist extension 64 75   Wrist pronation     Wrist supination     Digit Composite Flexion 3.5 0.5 1 1.5 2 cm  0 0.25 0 0.25 1.25 cm  Digit Composite Extension     Digit Opposition     (Blank rows = not tested)   UPPER EXTREMITY MMT:     MMT Right (eval) Left (eval)  Shoulder flexion 5/5 5/5  Shoulder abduction 5/5 5/5  Elbow flexion 5/5 5/5  Elbow extension 4+/5 5/5  (Blank rows = not tested)  HAND FUNCTION: Grip strength: Right: 44 lbs; Left: 49.1 lbs  COORDINATION: 9 Hole Peg test: Right: 28 sec; Left: 23 sec  SENSATION: Occasional paresthesias B (might be due to positioning of wrists)  EDEMA: none observed; mild reported (rings often feel tighter)  COGNITION: Overall cognitive status: Within functional limits for tasks assessed  OBSERVATIONS: Pt appears well-kept. Cording most notable in B palms with discoloration of hands dorsally. Unable to make fist on R. Pt ambulates without AD. No LOB,  no altered gait.   TODAY'S TREATMENT:                                                                                                                               Pt placed BUE in Fluidotherapy machine with supervised ROM x 10 min. Pt was educated to complete RUE PROM during modality time to improve ROM and decrease pain/stiffness of affected extremity by use of the machine's massaging action and thermal properties.   OT reviewed RUE and LUE red theraputty exercises as noted in patient instructions for stretching of bands, coordination, and strength . Pt required min cues for proper completion.  Therapist reviewed goals with patient and updated patient progression.  No additional functional limitations identified. Objective measures assessed as noted in Goals section to determine progression towards goals.  PATIENT EDUCATION: Education details: putty; OT d/c Person educated: Patient Education method: Explanation, Demonstration, and Handouts Education comprehension: verbalized understanding, returned demonstration, and needs further education  HOME EXERCISE PROGRAM: 03/24/2023: Scar massage - Access Code:[email or web address] 04/14/2023: tendon glides; finger flexion  Access Code: [email or web address]  Pt already has Access Code: [email or web address] URL: [email or web address] 04/28/2023: putty Access Code: [email or web address] URL: [email or web address]  GOALS:  SHORT TERM GOALS: MET 2/2  LONG TERM GOALS: Target date: 05/08/2023  Patient will demonstrate updated RUE and LUE HEP with 25% verbal cues or less for proper execution. Baseline:  Goal status: IN PROGRESS  2.  Patient will report at least two-point increase in average PSFS score or at least three-point increase in a single activity score indicating functionally significant improvement given minimum detectable change.  Baseline: 3.7 total score (See above for individual activity scores) Goal status: INITIAL  ASSESSMENT:  CLINICAL  IMPRESSION: Patient is appropriate for discharge and no longer demonstrates medical necessity for continued skilled occupational therapy services. PERFORMANCE DEFICITS: in functional skills including ADLs, IADLs, coordination, sensation, ROM, strength, fascial restrictions, decreased knowledge of precautions, decreased knowledge of use of DME, and UE functional use.   IMPAIRMENTS: are limiting patient from ADLs, IADLs, work, and leisure.   COMORBIDITIES: may have co-morbidities  that affects occupational performance. Patient will benefit from skilled OT to address above impairments and improve overall function.  REHAB POTENTIAL: Good  PLAN:  OT D/C Completed  Delana Meyer, OT 05/07/2023, 4:44 PM

## 2023-05-07 NOTE — Patient Instructions (Signed)
03/24/2023: Scar massage - Access Code:[email or web address] 04/14/2023: tendon glides; finger flexion  Access Code: [email or web address]  Pt already has Access Code: [email or web address] URL: [email or web address] 04/28/2023: putty Access Code: [email or web address] URL: [email or web address]

## 2024-10-10 ENCOUNTER — Ambulatory Visit: Admitting: Obstetrics and Gynecology
